# Patient Record
Sex: Female | Born: 1950
Health system: Southern US, Community
[De-identification: ages and names within clinical notes are randomized; demographics above are authoritative.]

## PROBLEM LIST (undated history)

## (undated) DIAGNOSIS — D649 Anemia, unspecified: Secondary | ICD-10-CM

## (undated) DIAGNOSIS — E785 Hyperlipidemia, unspecified: Secondary | ICD-10-CM

## (undated) DIAGNOSIS — E119 Type 2 diabetes mellitus without complications: Secondary | ICD-10-CM

## (undated) DIAGNOSIS — M1990 Unspecified osteoarthritis, unspecified site: Secondary | ICD-10-CM

## (undated) DIAGNOSIS — R0600 Dyspnea, unspecified: Secondary | ICD-10-CM

## (undated) DIAGNOSIS — N200 Calculus of kidney: Secondary | ICD-10-CM

## (undated) DIAGNOSIS — E0590 Thyrotoxicosis, unspecified without thyrotoxic crisis or storm: Secondary | ICD-10-CM

## (undated) DIAGNOSIS — Z87442 Personal history of urinary calculi: Secondary | ICD-10-CM

## (undated) DIAGNOSIS — K219 Gastro-esophageal reflux disease without esophagitis: Secondary | ICD-10-CM

## (undated) DIAGNOSIS — I10 Essential (primary) hypertension: Secondary | ICD-10-CM

## (undated) DIAGNOSIS — E559 Vitamin D deficiency, unspecified: Secondary | ICD-10-CM

## (undated) DIAGNOSIS — R509 Fever, unspecified: Secondary | ICD-10-CM

## (undated) HISTORY — PX: TONSILLECTOMY: SUR1361

## (undated) HISTORY — DX: Type 2 diabetes mellitus without complications: E11.9

## (undated) HISTORY — PX: COLONOSCOPY W/ POLYPECTOMY: SHX1380

## (undated) HISTORY — DX: Essential (primary) hypertension: I10

## (undated) HISTORY — DX: Hyperlipidemia, unspecified: E78.5

## (undated) HISTORY — PX: OTHER SURGICAL HISTORY: SHX169

## (undated) HISTORY — PX: ABDOMINAL HYSTERECTOMY: SHX81

---

## 2012-05-25 DIAGNOSIS — R079 Chest pain, unspecified: Secondary | ICD-10-CM

## 2012-05-25 DIAGNOSIS — I10 Essential (primary) hypertension: Secondary | ICD-10-CM

## 2017-08-22 LAB — LIPID PANEL
Cholesterol: 277 — AB (ref 0–200)
HDL: 46 (ref 35–70)
LDL CALC: 159
Triglycerides: 360 — AB (ref 40–160)

## 2017-08-22 LAB — BASIC METABOLIC PANEL
BUN: 9 (ref 4–21)
Creatinine: 0.8 (ref <=1.1)

## 2017-08-22 LAB — VITAMIN D 25 HYDROXY (VIT D DEFICIENCY, FRACTURES): Vit D, 25-Hydroxy: 10.4

## 2017-08-22 LAB — TSH: TSH: 1.23 (ref <=5.90)

## 2017-08-22 LAB — HEMOGLOBIN A1C: Hemoglobin A1C: 7

## 2017-08-22 LAB — HEPATIC FUNCTION PANEL: ALK PHOS: 163 — AB (ref 25–125)

## 2017-08-22 LAB — VITAMIN B12: VITAMIN B 12: 464

## 2017-09-18 ENCOUNTER — Ambulatory Visit: Payer: BLUE CROSS/BLUE SHIELD | Admitting: "Endocrinology

## 2017-09-18 ENCOUNTER — Encounter: Payer: Self-pay | Admitting: "Endocrinology

## 2017-09-18 VITALS — BP 126/80 | HR 81 | Ht 63.8 in | Wt 155.0 lb

## 2017-09-18 DIAGNOSIS — E210 Primary hyperparathyroidism: Secondary | ICD-10-CM | POA: Insufficient documentation

## 2017-09-18 DIAGNOSIS — E119 Type 2 diabetes mellitus without complications: Secondary | ICD-10-CM | POA: Insufficient documentation

## 2017-09-18 DIAGNOSIS — E559 Vitamin D deficiency, unspecified: Secondary | ICD-10-CM | POA: Insufficient documentation

## 2017-09-18 DIAGNOSIS — I10 Essential (primary) hypertension: Secondary | ICD-10-CM | POA: Diagnosis not present

## 2017-09-18 DIAGNOSIS — E212 Other hyperparathyroidism: Secondary | ICD-10-CM | POA: Diagnosis not present

## 2017-09-18 NOTE — Progress Notes (Signed)
Consult Note       09/18/2017, 5:57 PM  Beth Garcia is a 67 y.o.-year-old female, referred by her PMD, Dr. Gar Ponto , for evaluation for hypercalcemia/hyperparathyroidism.   Past Medical History:  Diagnosis Date  . Diabetes mellitus, type II ([place])   . Hyperlipidemia   . Hypertension     Past Surgical History:  Procedure Laterality Date  . ABDOMINAL HYSTERECTOMY    . TONSILLECTOMY      Social History   Tobacco Use  . Smoking status: Never Smoker  . Smokeless tobacco: Never Used  Substance Use Topics  . Alcohol use: No    Frequency: Never  . Drug use: No    Outpatient Encounter Medications as of 09/18/2017  Medication Sig  . atorvastatin (LIPITOR) 10 MG tablet Take 10 mg by mouth daily.  . Cholecalciferol (VITAMIN D3) 50000 units TABS Take by mouth.  . Cinnamon 500 MG TABS Take by mouth daily.  Marland Kitchen lisinopril (PRINIVIL,ZESTRIL) 20 MG tablet Take 20 mg by mouth daily.  Marland Kitchen omeprazole (PRILOSEC) 40 MG capsule Take 40 mg by mouth daily.   No facility-administered encounter medications on file as of 09/18/2017.     No Known Allergies   HPI  Beth Garcia reports that she has dealt with hypercalcemia for 65-99 years complicated by nephrolithiasis x1.  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 -She does not receive definitive treatment.  Does not recall if she was ever diagnosed with hyperparathyroidism.  She denies history of osteoporosis, did not have recent bone density.   -She does not recall if she ever had 24-hour urine calcium measurement.  -Her recent labs from August 15, 2017 revealed hypercalcemia of 11.6 associated with PTH of 90. I reviewed pt's DEXA scans:  No prior history of fragility fractures or falls. No history of CKD. Last BUN/Cr: 9/0.79  she is not on HCTZ or other thiazide therapy.  Was recently found to have significant vitamin D deficiency at 10.4,  initiated on vitamin D3 5000 units daily since August 18, 2017.  she is not on calcium supplements,  she eats dairy and green, leafy, vegetables on average amounts.  she does not have a family history of hypercalcemia, pituitary tumors, thyroid cancer, or osteoporosis.  -He reports remote history of mild parathyroidism which did not require any definitive antithyroid therapy.  I reviewed her chart and she also has a history of type 2 diabetes controlled with diet and exercise, recent A1c of 7%.    ROS:  Constitutional: -No recent weight changes, no fatigue, no subjective hyperthermia, no subjective hypothermia Eyes: no blurry vision, no xerophthalmia ENT: no sore throat, no nodules palpated in throat, no dysphagia/odynophagia, no hoarseness Cardiovascular: no Chest Pain, no Shortness of Breath, no palpitations, no leg swelling Respiratory: no cough, no SOB Gastrointestinal: no Nausea/Vomiting/Diarhhea Musculoskeletal: no muscle/joint aches Skin: no rashes Neurological: no tremors, no numbness, no tingling, no dizziness Psychiatric: no depression, no anxiety  PE: BP 126/80   Pulse 81   Ht 5' 3.8" (1.621 m)   Wt 155 lb (70.3 kg)   BMI 26.77 kg/m  Wt Readings  from Last 3 Encounters:  09/18/17 155 lb (70.3 kg)   Constitutional: + Appropriate weight for height, not in acute distress, normal state of mind Eyes: PERRLA, EOMI, no exophthalmos ENT: moist mucous membranes, no thyromegaly, no cervical lymphadenopathy Cardiovascular: normal precordial activity, Regular Rate and Rhythm, no Murmur/Rubs/Gallops Respiratory:  adequate breathing efforts, no gross chest deformity, Clear to auscultation bilaterally Gastrointestinal: abdomen soft, Non -tender, No distension, Bowel Sounds present Musculoskeletal: no gross deformities, strength intact in all four extremities Skin: moist, warm, no rashes Neurological: no tremor with outstretched hands, Deep tendon reflexes normal in all four  extremities.  Recent Results (from the past 2160 hour(s))  VITAMIN D 25 Hydroxy (Vit-D Deficiency, Fractures)     Status: None   Collection Time: 08/22/17 12:00 AM  Result Value Ref Range   Vit D, 25-Hydroxy 33.2   Basic metabolic panel     Status: None   Collection Time: 08/22/17 12:00 AM  Result Value Ref Range   BUN 9 4 - 21   Creatinine 0.8 0.5 - 1.1  Lipid panel     Status: Abnormal   Collection Time: 08/22/17 12:00 AM  Result Value Ref Range   Triglycerides 360 (A) 40 - 160   Cholesterol 277 (A) 0 - 200   HDL 46 35 - 70   LDL Cholesterol 159   Hepatic function panel     Status: Abnormal   Collection Time: 08/22/17 12:00 AM  Result Value Ref Range   Alkaline Phosphatase 163 (A) 25 - 125  Vitamin B12     Status: None   Collection Time: 08/22/17 12:00 AM  Result Value Ref Range   Vitamin B-12 464   TSH     Status: None   Collection Time: 08/22/17 12:00 AM  Result Value Ref Range   TSH 1.23 0.41 - 5.90  Hemoglobin A1c     Status: None   Collection Time: 08/22/17 12:00 AM  Result Value Ref Range   Hemoglobin A1C 7    On August 22, 2017 her PTH was elevated at 90 (normal 15-65)   Assessment: 1. Hypercalcemia / Hyperparathyroidism  Plan: Patient has had several instances of elevated calcium, with the highest level being at 11.6 mg/dL. A corresponding intact PTH level was also high, at 90.  - Patient also  has vitamin D deficiency,  with the last level being 10.4.  She is on appropriate vitamin D supplement with vitamin D3 5000 units daily on her fifth week of treatment. -She had at least one episode of nephrolithiasis as a complication of hypercalcemia/hyperparathyroidism: no history of    Osteoporosis, fragility fractures. No abdominal pain, no major mood disorders, no bone pain.  She has elevated alkaline phosphatase.  - I discussed with the patient about the physiology of calcium and parathyroid hormone, and possible  effects of  increased PTH/ Calcium , including  kidney stones, cardiac dysrhythmias, seizure disorders, osteoporosis, abdominal pain, etc.   - The work up so far is not sufficient to reach a conclusion for definitive therapy.  she  needs more studies to confirm and classify the parathyroid dysfunction she may have. I will proceed to obtain  repeat intact PTH/calcium, serum magnesium, serum phosphorus.  It is also essential to obtain 24-hour urine calcium/creatinine to rule out the rare but important cause of mild elevation in calcium and PTH- [place] ( Familial Hypocalciuric Hypercalcemia), which may not require any active intervention.  - I will request for her next DEXA scan to include the distal  33% of  radius for evaluation of cortical bone, which is predominantly affected by hyperparathyroidism.   -If she is confirmed to have primary hyperparathyroidism, she is a surgical candidate for parathyroidectomy. -Clinically she appears to be euthyroid and TSH is within normal range, no need for thyroid intervention. -She has a well-controlled type 1 diabetes with A1c of 7%, wishes to address it with her PMD.  - Time spent with the patient: 45 minutes, of which >50% was spent in obtaining information about her symptoms, reviewing her previous labs, evaluations, and treatments, counseling her about her hypercalcemia/hyperparathyroidism, and developing a plan to confirm the diagnosis and long term treatment as necessary.  Charlesetta Garibaldi participated in the discussions, expressed understanding, and voiced agreement with the above plans.  All questions were answered to her satisfaction. she is encouraged to contact clinic should she have any questions or concerns prior to her return visit.  - Return in about 2 weeks (around 10/02/2017) for follow up with pre-visit labs, 24 Hours Urine Calcium, follow up with Bone Density.   Glade Lloyd, MD [facility] [address] [place], [place] [address] Phone:  [phone]  Fax: [phone]    This note was partially dictated with voice recognition software. Similar sounding words can be transcribed inadequately or may not  be corrected upon review.  09/18/2017, 5:57 PM

## 2017-10-02 LAB — QUESTASSURED™ 25-HYDROXY AND 1,25-DIHYDROXYVITAMIN D
VITAMIN D 1, 25 (OH) TOTAL: 88 pg/mL — AB (ref 18–72)
Vitamin D, 25-OH, D2: <4 ng/mL
Vitamin D, 25-OH, D3: 41 ng/mL
Vitamin D, 25-OH, Total: 41 ng/mL (ref 30–100)
Vitamin D2 1, 25 (OH)2: <8 pg/mL
Vitamin D3 1, 25 (OH)2: 88 pg/mL

## 2017-10-02 LAB — PTH, INTACT AND CALCIUM
Calcium: 11.8 mg/dL — ABNORMAL HIGH (ref 8.6–10.4)
PTH: 155 pg/mL — ABNORMAL HIGH (ref 14–64)

## 2017-10-02 LAB — CORTISOL, URINE, 24 HOUR
24 HOUR URINE VOLUME (VMAHVA): 1700 mL
CORTISOL (UR), FREE: 16.2 ug/(24.h) (ref 4.0–50.0)
RESULTS RECEIVED: 0.96 g/(24.h) (ref 0.50–2.15)

## 2017-10-02 LAB — PHOSPHORUS: PHOSPHORUS: 2.4 mg/dL — AB (ref 2.5–4.5)

## 2017-10-02 LAB — MAGNESIUM: MAGNESIUM: 2.1 mg/dL (ref 1.5–2.5)

## 2017-10-05 ENCOUNTER — Encounter: Payer: Self-pay | Admitting: "Endocrinology

## 2017-10-05 ENCOUNTER — Ambulatory Visit (INDEPENDENT_AMBULATORY_CARE_PROVIDER_SITE_OTHER): Payer: BLUE CROSS/BLUE SHIELD | Admitting: "Endocrinology

## 2017-10-05 VITALS — BP 123/73 | HR 78 | Ht 63.8 in | Wt 152.0 lb

## 2017-10-05 DIAGNOSIS — E212 Other hyperparathyroidism: Secondary | ICD-10-CM | POA: Diagnosis not present

## 2017-10-05 DIAGNOSIS — E210 Primary hyperparathyroidism: Secondary | ICD-10-CM | POA: Diagnosis not present

## 2017-10-05 NOTE — Progress Notes (Signed)
Consult Note       10/05/2017, 3:43 PM  Beth Garcia is a 67 y.o.-year-old female, referred by her PMD, Dr. Gar Ponto , for evaluation for hypercalcemia/hyperparathyroidism.   Past Medical History:  Diagnosis Date   Diabetes mellitus, type II ([place])    Hyperlipidemia    Hypertension     Past Surgical History:  Procedure Laterality Date   ABDOMINAL HYSTERECTOMY     TONSILLECTOMY      Social History   Tobacco Use   Smoking status: Never Smoker   Smokeless tobacco: Never Used  Substance Use Topics   Alcohol use: No    Frequency: Never   Drug use: No    Outpatient Encounter Medications as of 10/05/2017  Medication Sig   atorvastatin (LIPITOR) 10 MG tablet Take 10 mg by mouth daily.   Cholecalciferol (VITAMIN D3) 50000 units TABS Take by mouth.   Cinnamon 500 MG TABS Take by mouth daily.   lisinopril (PRINIVIL,ZESTRIL) 20 MG tablet Take 20 mg by mouth daily.   omeprazole (PRILOSEC) 40 MG capsule Take 40 mg by mouth daily.   No facility-administered encounter medications on file as of 10/05/2017.     No Known Allergies   HPI  Beth Garcia reports that she has dealt with hypercalcemia for 60-45 years complicated by nephrolithiasis x1.  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 -She never received definitive treatment for hyperparathyroidism.   She denies history of osteoporosis.  she does not have recent bone density.   No prior history of fragility fractures or falls. No history of CKD. Last BUN/Cr: 9/0.79  she is not on HCTZ or other thiazide therapy.  Was recently found to have significant vitamin D deficiency at 10.4, initiated on vitamin D3 5000 units daily since August 18, 2017.  -Her most recent vitamin D panel showed corrected 25 hydroxy vitamin D at  64, 1, 25 dihydroxy vitamin D elevated at 105.  she is not on calcium supplements,  she eats dairy and green, leafy, vegetables on average amounts.  she does not have a family history of hypercalcemia, pituitary tumors, thyroid cancer, or osteoporosis.  -He reports remote history of mild hyperthyroidism which did not require any definitive antithyroid therapy.  I reviewed her chart and she also has a history of type 2 diabetes controlled with diet and exercise, recent A1c of 7%.    ROS:  Constitutional: -No recent weight change, no fatigue, no subjective hyperthermia nor hyponatremia.   Eyes: no blurry vision, no xerophthalmia ENT: no sore throat, no nodules palpated in throat, no dysphagia/odynophagia, no hoarseness Cardiovascular: no Chest Pain, no Shortness of Breath, no palpitations, no leg swelling Respiratory: no cough, no SOB Gastrointestinal: no Nausea/Vomiting/Diarhhea Musculoskeletal: no muscle/joint aches Skin: no rashes Neurological: No tremors, no numbness, no tingling.    Psychiatric: no depression, no anxiety  PE: BP 123/73    Pulse 78    Ht 5' 3.8" (1.621 m)    Wt 152 lb (68.9 kg)  BMI 26.25 kg/m  Wt Readings from Last 3 Encounters:  10/05/17 152 lb (68.9 kg)  09/18/17 155 lb (70.3 kg)   Constitutional: + Appropriate weight for height, alert and oriented x3, not in acute distress.   Eyes: PERRLA, EOMI, no exophthalmos ENT: moist mucous membranes, no thyromegaly, no cervical lymphadenopathy Musculoskeletal: no gross deformities, strength intact in all four extremities Skin: moist, warm, no rashes Neurological: no tremor with outstretched hands, Deep tendon reflexes normal in all four extremities.  Recent Results (from the past 2160 hour(s))  VITAMIN D 25 Hydroxy (Vit-D Deficiency, Fractures)     Status: None   Collection Time: 08/22/17 12:00 AM  Result Value Ref Range   Vit D, 25-Hydroxy 13.0   Basic metabolic panel     Status: None   Collection Time:  08/22/17 12:00 AM  Result Value Ref Range   BUN 9 4 - 21   Creatinine 0.8 0.5 - 1.1  Lipid panel     Status: Abnormal   Collection Time: 08/22/17 12:00 AM  Result Value Ref Range   Triglycerides 360 (A) 40 - 160   Cholesterol 277 (A) 0 - 200   HDL 46 35 - 70   LDL Cholesterol 159   Hepatic function panel     Status: Abnormal   Collection Time: 08/22/17 12:00 AM  Result Value Ref Range   Alkaline Phosphatase 163 (A) 25 - 125  Vitamin B12     Status: None   Collection Time: 08/22/17 12:00 AM  Result Value Ref Range   Vitamin B-12 464   TSH     Status: None   Collection Time: 08/22/17 12:00 AM  Result Value Ref Range   TSH 1.23 0.41 - 5.90  Hemoglobin A1c     Status: None   Collection Time: 08/22/17 12:00 AM  Result Value Ref Range   Hemoglobin A1C 7   PTH, intact and calcium     Status: Abnormal   Collection Time: 09/28/17  7:27 AM  Result Value Ref Range   PTH 155 (H) 14 - 64 pg/mL    Comment: . Interpretive Guide    Intact PTH           Calcium ------------------    ----------           ------- Normal Parathyroid    Normal               Normal Hypoparathyroidism    Low or Low Normal    Low Hyperparathyroidism    Primary            Normal or High       High    Secondary          High                 Normal or Low    Tertiary           High                 High Non-Parathyroid    Hypercalcemia      Low or Low Normal    High .    Calcium 11.8 (H) 8.6 - 10.4 mg/dL  Magnesium     Status: None   Collection Time: 09/28/17  7:27 AM  Result Value Ref Range   Magnesium 2.1 1.5 - 2.5 mg/dL  Phosphorus     Status: Abnormal   Collection Time: 09/28/17  7:27 AM  Result Value Ref Range  Phosphorus 2.4 (L) 2.5 - 4.5 mg/dL  [employer] 25-Hydroxy and 1,25-Dihydroxyvitamin D     Status: Abnormal   Collection Time: 09/28/17  7:27 AM  Result Value Ref Range   Vitamin D, 25-OH, Total 41 30 - 100 ng/mL    Comment: .  25-OHD3 indicates both endogenous production  and  supplementation.  25-OHD2 is an indicator of  exogenous sources, such as diet or supplementation.  Therapy is based on measurement of Total 25-OHD,  with levels <20 ng/mL indicative of Vitamin D  deficiency, while levels between 20 ng/mL and  30 ng/mL suggest insufficiency.  Optimal levels are > or = 30 ng/mL. Marland Kitchen  For additional information, please refer to  http://education.questdiagnostics.com/faq/FAQ163  (This link is being provided for   information/educational purposes only.)    Vitamin D, 25-OH, D3 41 See Below ng/mL    Comment: Reference Range: Not established   Vitamin D, 25-OH, D2 <4 See Below ng/mL    Comment: Reference Range: Not established   Vitamin D 1, 25 (OH)2 Total 88 (H) 18 - 72 pg/mL   Vitamin D3 1, 25 (OH)2 88 pg/mL   Vitamin D2 1, 25 (OH)2 <8 pg/mL    Comment: Marland Kitchen  Vitamin D, 1,25-Dihydroxy, LC/MS/MS:  Vitamin D3, 1,25(OH) indicates both endogenous  production and supplementation. Vitamin D2, 1,25(OH)2  is an indicator of exogenous sources, such as  diet or supplementation. Interpretation and  therapy are based on measurement of  Vitamin D, 1,25 (OH)2, Total. .  This test was developed and its analytical performance  characteristics have been determined by [facility]. It has not been cleared or approved by the [place] Food and Drug Administration. This assay has been validated  pursuant to the CLIA regulations and is used for clinical  purposes.   Cortisol,free,24 hour urine w/creatinine     Status: None   Collection Time: 09/28/17  7:27 AM  Result Value Ref Range   24 Hour urine volume (VMAHVA) 1,700 mL   Cortisol (Ur), Free 16.2 4.0 - 50.0 mcg/24 h    Comment: . Analysis performed by [employer] . This test was developed and its analytical performance characteristics have been determined by [facility]. It has not been cleared or approved by FDA. This assay has been  validated pursuant to the CLIA regulations and is used for clinical purposes.    RESULTS RECEIVED 0.96 0.50 - 2.15 g/24 h   On August 22, 2017 her PTH was elevated at 90 (normal 15-65)   Assessment: 1. Hypercalcemia / Hyperparathyroidism  Plan: Patient has had several instances of elevated calcium, with the highest level being at 11.8 mg/dL. A corresponding intact PTH level was also high, at 155 increasing from 90.  - Patient is vitamin D replete, remains on vitamin D supplement.   -She had at least one episode of nephrolithiasis as a complication of hypercalcemia/hyperparathyroidism: no history of    Osteoporosis, fragility fractures. No abdominal pain, no major mood disorders, no bone pain.  She has elevated alkaline phosphatase.  - I discussed with the patient about the physiology of calcium and parathyroid hormone, and possible  effects of  increased PTH/ Calcium , including kidney stones, cardiac dysrhythmias, seizure disorders, osteoporosis, abdominal pain, etc.  -24-hour urine calcium measurement was missed.  -She is a surgical candidate for primary hyperparathyroidism.  I discussed this option with her and she agrees with referral to Dr. Harlow Asa.  - I will request for her  next DEXA scan to include the distal  33% of  radius for evaluation of cortical bone, which is predominantly affected by hyperparathyroidism.   -Clinically she appears to be euthyroid and TSH is within normal range, no need for thyroid intervention. -She has a well-controlled type 1 diabetes with A1c of 7%, wishes to address it with her PMD.   - Time spent with the patient: 25 min, of which >50% was spent in reviewing her  current and  previous labs, previous treatments.  Beth Garcia participated in the discussions, expressed understanding, and voiced agreement with the above plans.  All questions were answered to her satisfaction. she is encouraged to contact clinic should she have any questions or concerns  prior to her return visit.  - Return in about 3 months (around 01/04/2018) for follow up with labs after surgery.   Glade Lloyd, MD [facility] [address] [place], [place] [address] Phone: [phone]  Fax: [phone]   This note was partially dictated with voice recognition software. Similar sounding words can be transcribed inadequately or may not  be corrected upon review.  10/05/2017, 3:43 PM

## 2017-10-13 ENCOUNTER — Telehealth: Payer: Self-pay | Admitting: "Endocrinology

## 2017-10-13 NOTE — Telephone Encounter (Signed)
Beth Garcia is calling stating that she has not heard from the surgeons office yet please advise?

## 2017-10-13 NOTE — Telephone Encounter (Signed)
Attempted to call pt. Phone not in service at this time. Sent referral again and will contact Dr Tera Helper office to confirm they received it

## 2017-11-02 ENCOUNTER — Other Ambulatory Visit (HOSPITAL_COMMUNITY): Payer: Self-pay | Admitting: Surgery

## 2017-11-02 DIAGNOSIS — E210 Primary hyperparathyroidism: Secondary | ICD-10-CM

## 2017-11-13 ENCOUNTER — Ambulatory Visit (HOSPITAL_COMMUNITY)
Admission: RE | Admit: 2017-11-13 | Discharge: 2017-11-13 | Disposition: A | Discharge status: Home / Self Care | Payer: BLUE CROSS/BLUE SHIELD | Source: Ambulatory Visit | Attending: Surgery | Admitting: Surgery

## 2017-11-13 DIAGNOSIS — R946 Abnormal results of thyroid function studies: Secondary | ICD-10-CM | POA: Insufficient documentation

## 2017-11-13 DIAGNOSIS — E034 Atrophy of thyroid (acquired): Secondary | ICD-10-CM | POA: Diagnosis not present

## 2017-11-13 DIAGNOSIS — E210 Primary hyperparathyroidism: Secondary | ICD-10-CM | POA: Insufficient documentation

## 2017-12-05 ENCOUNTER — Other Ambulatory Visit (HOSPITAL_COMMUNITY): Payer: Self-pay | Admitting: Surgery

## 2017-12-05 DIAGNOSIS — E210 Primary hyperparathyroidism: Secondary | ICD-10-CM

## 2017-12-20 ENCOUNTER — Encounter (HOSPITAL_COMMUNITY): Payer: Self-pay | Admitting: Radiology

## 2017-12-20 ENCOUNTER — Ambulatory Visit (HOSPITAL_COMMUNITY)
Admission: RE | Admit: 2017-12-20 | Discharge: 2017-12-20 | Disposition: A | Discharge status: Home / Self Care | Payer: BLUE CROSS/BLUE SHIELD | Source: Ambulatory Visit | Attending: Surgery | Admitting: Surgery

## 2017-12-20 DIAGNOSIS — E042 Nontoxic multinodular goiter: Secondary | ICD-10-CM | POA: Insufficient documentation

## 2017-12-20 DIAGNOSIS — E210 Primary hyperparathyroidism: Secondary | ICD-10-CM | POA: Insufficient documentation

## 2017-12-20 LAB — POCT I-STAT CREATININE: Creatinine, Ser: 0.9 mg/dL (ref 0.44–1.00)

## 2017-12-20 MED ORDER — IOHEXOL 300 MG/ML  SOLN
75.0000 mL | Freq: Once | INTRAMUSCULAR | Status: AC | PRN
  Administered 2017-12-20: 75 mL via INTRAVENOUS

## 2018-01-01 ENCOUNTER — Ambulatory Visit: Payer: Self-pay | Admitting: Surgery

## 2018-01-01 NOTE — H&P (Signed)
General Surgery [facility]  [name] DOB: Jul 12, 1950 Married / Language: English / Race: White Female   History of Present Illness  The patient is a 67 year old female who presents with primary hyperparathyroidism.  CHIEF COMPLAINT: primary hyperparathyroidism  Patient is referred by Dr. Gar Ponto and Dr. Bud Face for evaluation and management of suspected primary hyperparathyroidism. Patient has a long-standing history of hypercalcemia. This is being monitored by her primary care physician. She also has a distant history of hyperthyroidism for which she did not require additional treatment. Her most recent TSH level is normal at 1.23. She is not on any thyroid medications. Patient has had recent laboratory studies showing an elevated calcium level at 11.8 and an elevated intact PTH level at 155. 25-hydroxy vitamin D level was normal at 41. Patient does take vitamin D supplements. Patient had a recent bone density scan which unfortunately does show osteoporosis with a T score of -2.8. Patient underwent a nuclear medicine parathyroid scan on August 25, 2017. This was negative for parathyroid adenoma. Patient has had no prior head or neck surgery. There is no family history of endocrine neoplasms. 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 She presents today accompanied by her husband.   Past Surgical History Breast Biopsy  Bilateral. Colon Polyp Removal - Colonoscopy  Hysterectomy (not due to cancer) - Partial   Diagnostic Studies History  Colonoscopy  5-10 years ago Mammogram  >3 years ago Pap Smear  >5 years ago  Allergies  No Known Drug Allergies  Allergies Reconciled   Medication History Atorvastatin Calcium (10MG  Tablet, Oral) Active. Lisinopril (20MG  Tablet, Oral) Active. Omeprazole (40MG  Capsule DR, Oral) Active. Vitamin D3 (5000UNIT Capsule, Oral) Active. Cinnamon (500MG   Tablet, Oral) Active. Medications Reconciled  Social History Alcohol use  Occasional alcohol use. Caffeine use  Carbonated beverages, Coffee. No drug use  Tobacco use  Never smoker.  Family History Alcohol Abuse  Family Members In General. Arthritis  Father, Mother, Sister. Cerebrovascular Accident  Family Members In General. Depression  Daughter, Family Members In General. Hypertension  Daughter, Mother, Sister. Migraine Headache  Daughter. Thyroid problems  Family Members In General.  Pregnancy / Birth History Age at menarche  81 years. Age of menopause  [age] Contraceptive History  Oral contraceptives. Gravida  2 Length (months) of breastfeeding  7-12 Maternal age  76-25 Para  2  Other Problems Arthritis  Back Pain  Diabetes Mellitus  Gastroesophageal Reflux Disease  High blood pressure  Hypercholesterolemia  Oophorectomy  Left. Thyroid Disease     Review of Systems General Present- Fatigue. Not Present- Appetite Loss, Chills, Fever, Night Sweats, Weight Gain and Weight Loss. Skin Not Present- Change in Wart/Mole, Dryness, Hives, Jaundice, New Lesions, Non-Healing Wounds, Rash and Ulcer. HEENT Present- Hoarseness. Not Present- Earache, Hearing Loss, Nose Bleed, Oral Ulcers, Ringing in the Ears, Seasonal Allergies, Sinus Pain, Sore Throat, Visual Disturbances, Wears glasses/contact lenses and Yellow Eyes. Respiratory Present- Chronic Cough. Not Present- Bloody sputum, Difficulty Breathing, Snoring and Wheezing. Breast Not Present- Breast Mass, Breast Pain, Nipple Discharge and Skin Changes. Cardiovascular Present- Palpitations, Rapid Heart Rate and Shortness of Breath. Not Present- Chest Pain, Difficulty Breathing Lying Down, Leg Cramps and Swelling of Extremities. Female Genitourinary Present- Frequency. Not Present- Nocturia, Painful Urination, Pelvic Pain and Urgency. Musculoskeletal Present- Back Pain and Muscle Weakness. Not Present-  Joint Pain, Joint Stiffness, Muscle Pain and Swelling of Extremities. Endocrine Present- New Diabetes. Not  Present- Cold Intolerance, Excessive Hunger, Hair Changes, Heat Intolerance and Hot flashes.  Vitals  Weight: 152.4 lb Height: 62in Body Surface Area: 1.7 m Body Mass Index: 27.87 kg/m  Temp.: 98.60F(Temporal)  Pulse: 79 (Regular)  BP: 122/78 (Sitting, Left Arm, Standard)   Physical Exam  See vital signs recorded above  GENERAL APPEARANCE Development: normal Nutritional status: normal Gross deformities: none  SKIN Rash, lesions, ulcers: none Induration, erythema: none Nodules: none palpable  EYES Conjunctiva and lids: normal Pupils: equal and reactive Iris: normal bilaterally  EARS, NOSE, MOUTH, THROAT External ears: no lesion or deformity External nose: no lesion or deformity Hearing: grossly normal Lips: no lesion or deformity Dentition: normal for age Oral mucosa: moist  NECK Symmetric: yes Trachea: midline Thyroid: no palpable nodules in the thyroid bed  CHEST Respiratory effort: normal Retraction or accessory muscle use: no Breath sounds: normal bilaterally Rales, rhonchi, wheeze: none  CARDIOVASCULAR Auscultation: regular rhythm, normal rate Murmurs: none Pulses: carotid and radial pulse 2+ palpable Lower extremity edema: none Lower extremity varicosities: none  MUSCULOSKELETAL Station and gait: normal Digits and nails: no clubbing or cyanosis Muscle strength: grossly normal all extremities Range of motion: grossly normal all extremities Deformity: none  LYMPHATIC Cervical: none palpable Supraclavicular: none palpable  PSYCHIATRIC Oriented to person, place, and time: yes Mood and affect: normal for situation Judgment and insight: appropriate for situation    Assessment & Plan  PRIMARY HYPERPARATHYROIDISM (E21.0)  Pt Education - Pamphlet Given - The Parathyroid Surgery Book: discussed with patient and provided  information. Follow Up - Call [facility] office after tests / studies doneto discuss further plans  Patient presents today accompanied by her husband to be evaluated for suspected primary hyperparathyroidism. Patient is provided with written literature on parathyroid disease to review at home.  Patient does have biochemical evidence of primary hyperparathyroidism. She is symptomatic with intermittent fatigue and with significant osteoporosis. I have recommended proceeding with further evaluation to include a 24-hour urine collection for calcium and a high-resolution ultrasound examination of the neck. Nuclear medicine parathyroid scan was negative for parathyroid adenoma. This occurs at approximately 20% of studies. Hopefully the ultrasound will identify an abnormal parathyroid gland. If not, we will order a 4D CT scan of the neck with parathyroid protocol in hopes of localizing a parathyroid adenoma.  We discussed the fact that 95% of patients have a single gland adenoma. There is a possibility of multi-gland disease. We discussed minimally invasive parathyroid surgery versus the traditional neck exploration. Patient understands and agrees to proceed with further workup at this time.  Patient will undergo the above studies and we will contact her with those results when they are available.  ADDENDUM The 4D CT scan was positive for two abnormal parathyroid glands on the right side, most likely representing adenomas.  Plan right neck exploration and parathyroidectomy with frozen sections.  The risks and benefits of the procedure have been discussed at length with the patient.  The patient understands the proposed procedure, potential alternative treatments, and the course of recovery to be expected.  All of the patient's questions have been answered at this time.  The patient wishes to proceed with surgery.  Armandina Gemma, [place] [facility] Office: [phone]

## 2018-01-26 ENCOUNTER — Encounter (HOSPITAL_COMMUNITY): Payer: Self-pay

## 2018-01-26 NOTE — Patient Instructions (Signed)
Your procedure is scheduled on: Thursday, Aug. 1, 2019   Surgery Time:  8:30AM-9:30AM   Report to [facility] Main  Entrance    Report to admitting at 6:30 AM   Call this number if you have problems the morning of surgery [phone]   Do not eat food or drink liquids :After Midnight.   Do NOT smoke after Midnight   Take these medicines the morning of surgery with A SIP OF WATER: Atorvastatin, Omeprazole   DO NOT TAKE ANY DIABETIC MEDICATIONS DAY OF YOUR SURGERY                               You may not have any metal on your body including hair pins, jewelry, and body piercings             Do not wear make-up, lotions, powders, perfumes/cologne, or deodorant             Do not wear nail polish.  Do not shave  48 hours prior to surgery.                Do not bring valuables to the hospital. [place].   Contacts, dentures or bridgework may not be worn into surgery.    Patients discharged the day of surgery will not be allowed to drive home.   Special Instructions: Bring a copy of your healthcare power of attorney and living will documents         the day of surgery if you haven't scanned them in before.              Please read over the following fact sheets you were given:  [facility] - Preparing for Surgery Before surgery, you can play an important role.  Because skin is not sterile, your skin needs to be as free of germs as possible.  You can reduce the number of germs on your skin by washing with CHG (chlorahexidine gluconate) soap before surgery.  CHG is an antiseptic cleaner which kills germs and bonds with the skin to continue killing germs even after washing. Please DO NOT use if you have an allergy to CHG or antibacterial soaps.  If your skin becomes reddened/irritated stop using the CHG and inform your nurse when you arrive at Short Stay. Do not shave (including legs and underarms) for at least 48 hours  prior to the first CHG shower.  You may shave your face/neck.  Please follow these instructions carefully:  1.  Shower with CHG Soap the night before surgery and the  morning of surgery.  2.  If you choose to wash your hair, wash your hair first as usual with your normal  shampoo.  3.  After you shampoo, rinse your hair and body thoroughly to remove the shampoo.                             4.  Use CHG as you would any other liquid soap.  You can apply chg directly to the skin and wash.  Gently with a scrungie or clean washcloth.  5.  Apply the CHG Soap to your body ONLY FROM THE NECK DOWN.   Do   not use on face/ open  Wound or open sores. Avoid contact with eyes, ears mouth and   genitals (private parts).                       Wash face,  Genitals (private parts) with your normal soap.             6.  Wash thoroughly, paying special attention to the area where your    surgery  will be performed.  7.  Thoroughly rinse your body with warm water from the neck down.  8.  DO NOT shower/wash with your normal soap after using and rinsing off the CHG Soap.                9.  Pat yourself dry with a clean towel.            10.  Wear clean pajamas.            11.  Place clean sheets on your bed the night of your first shower and do not  sleep with pets. Day of Surgery : Do not apply any lotions/deodorants the morning of surgery.  Please wear clean clothes to the hospital/surgery center.  FAILURE TO FOLLOW THESE INSTRUCTIONS MAY RESULT IN THE CANCELLATION OF YOUR SURGERY  PATIENT SIGNATURE_________________________________  NURSE SIGNATURE__________________________________  ________________________________________________________________________

## 2018-01-28 ENCOUNTER — Encounter (HOSPITAL_COMMUNITY): Payer: Self-pay | Admitting: Surgery

## 2018-01-28 DIAGNOSIS — M810 Age-related osteoporosis without current pathological fracture: Secondary | ICD-10-CM | POA: Diagnosis present

## 2018-01-28 NOTE — H&P (Signed)
General Surgery [facility]  Beth Garcia DOB: June 21, 1951 Married / Language: English / Race: White Female   History of Present Illness   The patient is a 67 year old female who presents with primary hyperparathyroidism.  CHIEF COMPLAINT: primary hyperparathyroidism  Patient is referred by Dr. Gar Ponto and Dr. Bud Face for evaluation and management of suspected primary hyperparathyroidism. Patient has a long-standing history of hypercalcemia. This is being monitored by her primary care physician. She also has a distant history of hyperthyroidism for which she did not require additional treatment. Her most recent TSH level is normal at 1.23. She is not on any thyroid medications. Patient has had recent laboratory studies showing an elevated calcium level at 11.8 and an elevated intact PTH level at 155. 25-hydroxy vitamin D level was normal at 41. Patient does take vitamin D supplements. Patient had a recent bone density scan which unfortunately does show osteoporosis with a T score of -2.8. Patient underwent a nuclear medicine parathyroid scan on August 25, 2017. This was negative for parathyroid adenoma. Patient has had no prior head or neck surgery. There is no family history of endocrine neoplasms. 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 She presents today accompanied by her husband.   Past Surgical History  Breast Biopsy  Bilateral. Colon Polyp Removal - Colonoscopy  Hysterectomy (not due to cancer) - Partial   Diagnostic Studies History  Colonoscopy  5-10 years ago Mammogram  >3 years ago Pap Smear  >5 years ago  Allergies  No Known Drug Allergies [11/01/2017]: Allergies Reconciled   Medication History Atorvastatin Calcium (10MG  Tablet, Oral) Active. Lisinopril (20MG  Tablet, Oral) Active. Omeprazole (40MG  Capsule DR, Oral) Active. Vitamin D3 (5000UNIT Capsule, Oral)  Active. Cinnamon (500MG  Tablet, Oral) Active. Medications Reconciled  Social History Alcohol use  Occasional alcohol use. Caffeine use  Carbonated beverages, Coffee. No drug use  Tobacco use  Never smoker.  Family History Alcohol Abuse  Family Members In General. Arthritis  Father, Mother, Sister. Cerebrovascular Accident  Family Members In General. Depression  Daughter, Family Members In General. Hypertension  Daughter, Mother, Sister. Migraine Headache  Daughter. Thyroid problems  Family Members In General.  Pregnancy / Birth History Age at menarche  76 years. Age of menopause  35-60 Contraceptive History  Oral contraceptives. Gravida  2 Length (months) of breastfeeding  7-12 Maternal age  28-25 Para  2  Other Problems Arthritis  Back Pain  Diabetes Mellitus  Gastroesophageal Reflux Disease  High blood pressure  Hypercholesterolemia  Oophorectomy  Left. Thyroid Disease   Review of Systems General Present- Fatigue. Not Present- Appetite Loss, Chills, Fever, Night Sweats, Weight Gain and Weight Loss. Skin Not Present- Change in Wart/Mole, Dryness, Hives, Jaundice, New Lesions, Non-Healing Wounds, Rash and Ulcer. HEENT Present- Hoarseness. Not Present- Earache, Hearing Loss, Nose Bleed, Oral Ulcers, Ringing in the Ears, Seasonal Allergies, Sinus Pain, Sore Throat, Visual Disturbances, Wears glasses/contact lenses and Yellow Eyes. Respiratory Present- Chronic Cough. Not Present- Bloody sputum, Difficulty Breathing, Snoring and Wheezing. Breast Not Present- Breast Mass, Breast Pain, Nipple Discharge and Skin Changes. Cardiovascular Present- Palpitations, Rapid Heart Rate and Shortness of Breath. Not Present- Chest Pain, Difficulty Breathing Lying Down, Leg Cramps and Swelling of Extremities. Female Genitourinary Present- Frequency. Not Present- Nocturia, Painful Urination, Pelvic Pain and Urgency. Musculoskeletal Present- Back Pain and Muscle  Weakness. Not Present- Joint Pain, Joint Stiffness, Muscle Pain and Swelling of Extremities. Endocrine Present- New Diabetes. Not  Present- Cold Intolerance, Excessive Hunger, Hair Changes, Heat Intolerance and Hot flashes.  Vitals Weight: 152.4 lb Height: 62in Body Surface Area: 1.7 m Body Mass Index: 27.87 kg/m  Temp.: 98.21F(Temporal)  Pulse: 79 (Regular)  BP: 122/78 (Sitting, Left Arm, Standard)  Physical Exam  See vital signs recorded above  GENERAL APPEARANCE Development: normal Nutritional status: normal Gross deformities: none  SKIN Rash, lesions, ulcers: none Induration, erythema: none Nodules: none palpable  EYES Conjunctiva and lids: normal Pupils: equal and reactive Iris: normal bilaterally  EARS, NOSE, MOUTH, THROAT External ears: no lesion or deformity External nose: no lesion or deformity Hearing: grossly normal Lips: no lesion or deformity Dentition: normal for age Oral mucosa: moist  NECK Symmetric: yes Trachea: midline Thyroid: no palpable nodules in the thyroid bed  CHEST Respiratory effort: normal Retraction or accessory muscle use: no Breath sounds: normal bilaterally Rales, rhonchi, wheeze: none  CARDIOVASCULAR Auscultation: regular rhythm, normal rate Murmurs: none Pulses: carotid and radial pulse 2+ palpable Lower extremity edema: none Lower extremity varicosities: none  MUSCULOSKELETAL Station and gait: normal Digits and nails: no clubbing or cyanosis Muscle strength: grossly normal all extremities Range of motion: grossly normal all extremities Deformity: none  LYMPHATIC Cervical: none palpable Supraclavicular: none palpable  PSYCHIATRIC Oriented to person, place, and time: yes Mood and affect: normal for situation Judgment and insight: appropriate for situation    Assessment & Plan  PRIMARY HYPERPARATHYROIDISM (E21.0)  Pt Education - Pamphlet Given - The Parathyroid Surgery Book: discussed with  patient and provided information.  Follow Up - Call [facility] office after tests / studies doneto discuss further plans  Patient presents today accompanied by her husband to be evaluated for suspected primary hyperparathyroidism. Patient is provided with written literature on parathyroid disease to review at home.  Patient does have biochemical evidence of primary hyperparathyroidism. She is symptomatic with intermittent fatigue and with significant osteoporosis. I have recommended proceeding with further evaluation to include a 24-hour urine collection for calcium and a high-resolution ultrasound examination of the neck. Nuclear medicine parathyroid scan was negative for parathyroid adenoma. This occurs at approximately 20% of studies. Hopefully the ultrasound will identify an abnormal parathyroid gland. If not, we will order a 4D CT scan of the neck with parathyroid protocol in hopes of localizing a parathyroid adenoma.  We discussed the fact that 95% of patients have a single gland adenoma. There is a possibility of multi-gland disease. We discussed minimally invasive parathyroid surgery versus the traditional neck exploration. Patient understands and agrees to proceed with further workup at this time.  Patient will undergo the above studies and we will contact her with those results when they are available.  OSTEOPOROSIS, POSTMENOPAUSAL (M81.0)  ADDENDUM:  4D CT scan localizes a parathyroid adenoma and confirms diagnosis of primary hyperparathyroidism.  Plan minimally invasive surgical approach.  The risks and benefits of the procedure have been discussed at length with the patient.  The patient understands the proposed procedure, potential alternative treatments, and the course of recovery to be expected.  All of the patient's questions have been answered at this time.  The patient wishes to proceed with surgery.  Armandina Gemma, [place] [facility] Office: [phone]

## 2018-01-29 ENCOUNTER — Encounter (HOSPITAL_COMMUNITY): Payer: Self-pay

## 2018-01-29 ENCOUNTER — Other Ambulatory Visit: Payer: Self-pay

## 2018-01-29 ENCOUNTER — Encounter (HOSPITAL_COMMUNITY)
Admission: RE | Admit: 2018-01-29 | Discharge: 2018-01-29 | Disposition: A | Discharge status: Home / Self Care | Payer: BLUE CROSS/BLUE SHIELD | Source: Ambulatory Visit | Attending: Surgery | Admitting: Surgery

## 2018-01-29 DIAGNOSIS — E210 Primary hyperparathyroidism: Secondary | ICD-10-CM

## 2018-01-29 DIAGNOSIS — Z01810 Encounter for preprocedural cardiovascular examination: Secondary | ICD-10-CM

## 2018-01-29 DIAGNOSIS — Z01818 Encounter for other preprocedural examination: Secondary | ICD-10-CM | POA: Insufficient documentation

## 2018-01-29 DIAGNOSIS — D351 Benign neoplasm of parathyroid gland: Secondary | ICD-10-CM | POA: Diagnosis not present

## 2018-01-29 DIAGNOSIS — I10 Essential (primary) hypertension: Secondary | ICD-10-CM | POA: Diagnosis not present

## 2018-01-29 DIAGNOSIS — E119 Type 2 diabetes mellitus without complications: Secondary | ICD-10-CM | POA: Diagnosis not present

## 2018-01-29 DIAGNOSIS — E7800 Pure hypercholesterolemia, unspecified: Secondary | ICD-10-CM | POA: Diagnosis not present

## 2018-01-29 DIAGNOSIS — K219 Gastro-esophageal reflux disease without esophagitis: Secondary | ICD-10-CM | POA: Diagnosis not present

## 2018-01-29 DIAGNOSIS — Z79899 Other long term (current) drug therapy: Secondary | ICD-10-CM | POA: Diagnosis not present

## 2018-01-29 HISTORY — DX: Calculus of kidney: N20.0

## 2018-01-29 HISTORY — DX: Personal history of urinary calculi: Z87.442

## 2018-01-29 HISTORY — DX: Unspecified osteoarthritis, unspecified site: M19.90

## 2018-01-29 HISTORY — DX: Gastro-esophageal reflux disease without esophagitis: K21.9

## 2018-01-29 HISTORY — DX: Thyrotoxicosis, unspecified without thyrotoxic crisis or storm: E05.90

## 2018-01-29 HISTORY — DX: Hypercalcemia: E83.52

## 2018-01-29 HISTORY — DX: Fever, unspecified: R50.9

## 2018-01-29 HISTORY — DX: Vitamin D deficiency, unspecified: E55.9

## 2018-01-29 LAB — BASIC METABOLIC PANEL
Anion gap: 7 (ref 5–15)
BUN: 19 mg/dL (ref 8–23)
CALCIUM: 11.7 mg/dL — AB (ref 8.9–10.3)
CHLORIDE: 110 mmol/L (ref 98–111)
CO2: 27 mmol/L (ref 22–32)
CREATININE: 0.99 mg/dL (ref 0.44–1.00)
GFR calc non Af Amer: 59 mL/min — ABNORMAL LOW (ref >60)
Glucose, Bld: 97 mg/dL (ref 70–99)
Potassium: 4.4 mmol/L (ref 3.5–5.1)
SODIUM: 144 mmol/L (ref 135–145)

## 2018-01-29 LAB — GLUCOSE, CAPILLARY: GLUCOSE-CAPILLARY: 89 mg/dL (ref 70–99)

## 2018-01-29 LAB — HEMOGLOBIN A1C
Hgb A1c MFr Bld: 6 % — ABNORMAL HIGH (ref 4.8–5.6)
Mean Plasma Glucose: 125.5 mg/dL

## 2018-01-29 LAB — CBC
HCT: 39.4 % (ref 36.0–46.0)
Hemoglobin: 12.9 g/dL (ref 12.0–15.0)
MCH: 27.9 pg (ref 26.0–34.0)
MCHC: 32.7 g/dL (ref 30.0–36.0)
MCV: 85.1 fL (ref 78.0–100.0)
PLATELETS: 201 10*3/uL (ref 150–400)
RBC: 4.63 MIL/uL (ref 3.87–5.11)
RDW: 14.3 % (ref 11.5–15.5)
WBC: 7.6 10*3/uL (ref 4.0–10.5)

## 2018-01-29 NOTE — Pre-Procedure Instructions (Signed)
BMP and Hgb A1C results 01/29/18 faxed to Dr. Harlow Asa via epic.

## 2018-01-30 NOTE — Pre-Procedure Instructions (Signed)
Left chart with Ihechi  to follow-up with EKG results from 01/29/18.

## 2018-02-01 ENCOUNTER — Ambulatory Visit (HOSPITAL_COMMUNITY)
Admission: RE | Admit: 2018-02-01 | Discharge: 2018-02-01 | Disposition: A | Discharge status: Home / Self Care | Payer: BLUE CROSS/BLUE SHIELD | Source: Ambulatory Visit | Attending: Surgery | Admitting: Surgery

## 2018-02-01 ENCOUNTER — Ambulatory Visit (HOSPITAL_COMMUNITY): Payer: BLUE CROSS/BLUE SHIELD | Admitting: Certified Registered"

## 2018-02-01 ENCOUNTER — Encounter (HOSPITAL_COMMUNITY)
Admission: RE | Disposition: A | Discharge status: Home / Self Care | Payer: Self-pay | Source: Ambulatory Visit | Attending: Surgery

## 2018-02-01 ENCOUNTER — Encounter (HOSPITAL_COMMUNITY): Payer: Self-pay | Admitting: *Deleted

## 2018-02-01 DIAGNOSIS — E210 Primary hyperparathyroidism: Secondary | ICD-10-CM | POA: Insufficient documentation

## 2018-02-01 DIAGNOSIS — E119 Type 2 diabetes mellitus without complications: Secondary | ICD-10-CM | POA: Insufficient documentation

## 2018-02-01 DIAGNOSIS — I10 Essential (primary) hypertension: Secondary | ICD-10-CM | POA: Insufficient documentation

## 2018-02-01 DIAGNOSIS — D351 Benign neoplasm of parathyroid gland: Secondary | ICD-10-CM | POA: Insufficient documentation

## 2018-02-01 DIAGNOSIS — K219 Gastro-esophageal reflux disease without esophagitis: Secondary | ICD-10-CM | POA: Insufficient documentation

## 2018-02-01 DIAGNOSIS — M810 Age-related osteoporosis without current pathological fracture: Secondary | ICD-10-CM | POA: Diagnosis present

## 2018-02-01 DIAGNOSIS — Z79899 Other long term (current) drug therapy: Secondary | ICD-10-CM | POA: Insufficient documentation

## 2018-02-01 DIAGNOSIS — E7800 Pure hypercholesterolemia, unspecified: Secondary | ICD-10-CM | POA: Insufficient documentation

## 2018-02-01 HISTORY — PX: PARATHYROIDECTOMY: SHX19

## 2018-02-01 LAB — GLUCOSE, CAPILLARY
GLUCOSE-CAPILLARY: 106 mg/dL — AB (ref 70–99)
Glucose-Capillary: 117 mg/dL — ABNORMAL HIGH (ref 70–99)

## 2018-02-01 SURGERY — PARATHYROIDECTOMY
Anesthesia: General | Site: Neck | Laterality: Right

## 2018-02-01 MED ORDER — SODIUM CHLORIDE 0.9 % IR SOLN
Status: DC | PRN
  Administered 2018-02-01: 1000 mL

## 2018-02-01 MED ORDER — ONDANSETRON HCL 4 MG/2ML IJ SOLN
4.0000 mg | Freq: Four times a day (QID) | INTRAMUSCULAR | Status: AC | PRN
  Administered 2018-02-01: 4 mg via INTRAVENOUS

## 2018-02-01 MED ORDER — LIDOCAINE 2% (20 MG/ML) 5 ML SYRINGE
INTRAMUSCULAR | Status: DC | PRN
  Administered 2018-02-01: 25 mg via INTRAVENOUS

## 2018-02-01 MED ORDER — PHENYLEPHRINE 40 MCG/ML (10ML) SYRINGE FOR IV PUSH (FOR BLOOD PRESSURE SUPPORT)
PREFILLED_SYRINGE | INTRAVENOUS | Status: AC
  Filled 2018-02-01: qty 10

## 2018-02-01 MED ORDER — DEXAMETHASONE SODIUM PHOSPHATE 10 MG/ML IJ SOLN
INTRAMUSCULAR | Status: AC
  Filled 2018-02-01: qty 1

## 2018-02-01 MED ORDER — ROCURONIUM BROMIDE 10 MG/ML (PF) SYRINGE
PREFILLED_SYRINGE | INTRAVENOUS | Status: AC
  Filled 2018-02-01: qty 10

## 2018-02-01 MED ORDER — PROMETHAZINE HCL 25 MG/ML IJ SOLN
6.2500 mg | INTRAMUSCULAR | Status: DC | PRN
  Administered 2018-02-01: 6.25 mg via INTRAVENOUS

## 2018-02-01 MED ORDER — CHLORHEXIDINE GLUCONATE CLOTH 2 % EX PADS: 6.0000 | MEDICATED_PAD | Freq: Once | CUTANEOUS | Status: DC

## 2018-02-01 MED ORDER — DEXAMETHASONE SODIUM PHOSPHATE 10 MG/ML IJ SOLN
INTRAMUSCULAR | Status: DC | PRN
  Administered 2018-02-01: 10 mg via INTRAVENOUS

## 2018-02-01 MED ORDER — ONDANSETRON HCL 4 MG/2ML IJ SOLN
INTRAMUSCULAR | Status: AC
  Filled 2018-02-01: qty 2

## 2018-02-01 MED ORDER — LACTATED RINGERS IV SOLN
INTRAVENOUS | Status: DC
  Administered 2018-02-01: 07:00:00 via INTRAVENOUS

## 2018-02-01 MED ORDER — ROCURONIUM BROMIDE 10 MG/ML (PF) SYRINGE
PREFILLED_SYRINGE | INTRAVENOUS | Status: DC | PRN
  Administered 2018-02-01: 40 mg via INTRAVENOUS

## 2018-02-01 MED ORDER — BUPIVACAINE HCL 0.25 % IJ SOLN
INTRAMUSCULAR | Status: DC | PRN
  Administered 2018-02-01: 10 mL

## 2018-02-01 MED ORDER — FENTANYL CITRATE (PF) 250 MCG/5ML IJ SOLN
INTRAMUSCULAR | Status: AC
  Filled 2018-02-01: qty 5

## 2018-02-01 MED ORDER — FENTANYL CITRATE (PF) 100 MCG/2ML IJ SOLN
INTRAMUSCULAR | Status: AC
  Filled 2018-02-01: qty 2

## 2018-02-01 MED ORDER — PHENYLEPHRINE 40 MCG/ML (10ML) SYRINGE FOR IV PUSH (FOR BLOOD PRESSURE SUPPORT)
PREFILLED_SYRINGE | INTRAVENOUS | Status: DC | PRN
  Administered 2018-02-01: 40 ug via INTRAVENOUS
  Administered 2018-02-01: 80 ug via INTRAVENOUS

## 2018-02-01 MED ORDER — OXYCODONE HCL 5 MG/5ML PO SOLN
5.0000 mg | Freq: Once | ORAL | Status: DC | PRN
  Filled 2018-02-01: qty 5

## 2018-02-01 MED ORDER — PROPOFOL 10 MG/ML IV BOLUS
INTRAVENOUS | Status: AC
  Filled 2018-02-01: qty 40

## 2018-02-01 MED ORDER — TRAMADOL HCL 50 MG PO TABS: 50.0000 mg | ORAL_TABLET | Freq: Once | ORAL | Status: DC

## 2018-02-01 MED ORDER — MIDAZOLAM HCL 2 MG/2ML IJ SOLN
INTRAMUSCULAR | Status: AC
  Filled 2018-02-01: qty 2

## 2018-02-01 MED ORDER — FENTANYL CITRATE (PF) 100 MCG/2ML IJ SOLN
25.0000 ug | INTRAMUSCULAR | Status: DC | PRN
  Administered 2018-02-01 (×2): 50 ug via INTRAVENOUS

## 2018-02-01 MED ORDER — HYDROMORPHONE HCL 1 MG/ML IJ SOLN
0.2500 mg | INTRAMUSCULAR | Status: DC | PRN
  Administered 2018-02-01 (×2): 0.5 mg via INTRAVENOUS

## 2018-02-01 MED ORDER — LIDOCAINE 2% (20 MG/ML) 5 ML SYRINGE
INTRAMUSCULAR | Status: AC
  Filled 2018-02-01: qty 5

## 2018-02-01 MED ORDER — SUGAMMADEX SODIUM 500 MG/5ML IV SOLN
INTRAVENOUS | Status: DC | PRN
  Administered 2018-02-01: 120 mg via INTRAVENOUS

## 2018-02-01 MED ORDER — CEFAZOLIN SODIUM-DEXTROSE 2-4 GM/100ML-% IV SOLN
2.0000 g | INTRAVENOUS | Status: AC
  Administered 2018-02-01: 2 g via INTRAVENOUS
  Filled 2018-02-01: qty 100

## 2018-02-01 MED ORDER — MIDAZOLAM HCL 2 MG/2ML IJ SOLN
INTRAMUSCULAR | Status: DC | PRN
  Administered 2018-02-01: 2 mg via INTRAVENOUS

## 2018-02-01 MED ORDER — PROPOFOL 10 MG/ML IV BOLUS
INTRAVENOUS | Status: DC | PRN
  Administered 2018-02-01: 160 mg via INTRAVENOUS

## 2018-02-01 MED ORDER — ONDANSETRON HCL 4 MG/2ML IJ SOLN
INTRAMUSCULAR | Status: DC | PRN
  Administered 2018-02-01: 4 mg via INTRAVENOUS

## 2018-02-01 MED ORDER — TRAMADOL HCL 50 MG PO TABS: 50.0000 mg | ORAL_TABLET | Freq: Four times a day (QID) | ORAL | 0 refills | Status: DC | PRN

## 2018-02-01 MED ORDER — FENTANYL CITRATE (PF) 250 MCG/5ML IJ SOLN
INTRAMUSCULAR | Status: DC | PRN
  Administered 2018-02-01: 25 ug via INTRAVENOUS
  Administered 2018-02-01: 50 ug via INTRAVENOUS
  Administered 2018-02-01: 100 ug via INTRAVENOUS

## 2018-02-01 MED ORDER — HYDROMORPHONE HCL 1 MG/ML IJ SOLN
INTRAMUSCULAR | Status: AC
  Filled 2018-02-01: qty 1

## 2018-02-01 MED ORDER — PROMETHAZINE HCL 25 MG/ML IJ SOLN
INTRAMUSCULAR | Status: AC
  Filled 2018-02-01: qty 1

## 2018-02-01 MED ORDER — SUGAMMADEX SODIUM 500 MG/5ML IV SOLN
INTRAVENOUS | Status: AC
  Filled 2018-02-01: qty 5

## 2018-02-01 MED ORDER — BUPIVACAINE HCL (PF) 0.25 % IJ SOLN
INTRAMUSCULAR | Status: AC
  Filled 2018-02-01: qty 30

## 2018-02-01 MED ORDER — OXYCODONE HCL 5 MG PO TABS: 5.0000 mg | ORAL_TABLET | Freq: Once | ORAL | Status: DC | PRN

## 2018-02-01 SURGICAL SUPPLY — 34 items
ATTRACTOMAT 16X20 MAGNETIC DRP (DRAPES) ×2 IMPLANT
BLADE HEX COATED 2.75 (ELECTRODE) ×2 IMPLANT
BLADE SURG 15 STRL LF DISP TIS (BLADE) ×1 IMPLANT
BLADE SURG 15 STRL SS (BLADE) ×1
CHLORAPREP W/TINT 26ML (MISCELLANEOUS) ×2 IMPLANT
CLIP VESOCCLUDE MED 6/CT (CLIP) ×4 IMPLANT
CLIP VESOCCLUDE SM WIDE 6/CT (CLIP) ×4 IMPLANT
DERMABOND ADVANCED (GAUZE/BANDAGES/DRESSINGS)
DERMABOND ADVANCED .7 DNX12 (GAUZE/BANDAGES/DRESSINGS) IMPLANT
DRAPE LAPAROTOMY T 98X78 PEDS (DRAPES) ×2 IMPLANT
ELECT PENCIL ROCKER SW 15FT (MISCELLANEOUS) ×2 IMPLANT
ELECT REM PT RETURN 15FT ADLT (MISCELLANEOUS) ×2 IMPLANT
GAUZE 4X4 16PLY RFD (DISPOSABLE) ×2 IMPLANT
GLOVE SURG ORTHO 8.0 STRL STRW (GLOVE) ×2 IMPLANT
GOWN STRL REUS W/TWL XL LVL3 (GOWN DISPOSABLE) ×6 IMPLANT
HEMOSTAT SURGICEL 2X4 FIBR (HEMOSTASIS) ×2 IMPLANT
ILLUMINATOR WAVEGUIDE N/F (MISCELLANEOUS) IMPLANT
KIT BASIN OR (CUSTOM PROCEDURE TRAY) ×2 IMPLANT
LIGHT WAVEGUIDE WIDE FLAT (MISCELLANEOUS) IMPLANT
NEEDLE HYPO 25X1 1.5 SAFETY (NEEDLE) ×2 IMPLANT
PACK BASIC VI WITH GOWN DISP (CUSTOM PROCEDURE TRAY) ×2 IMPLANT
POWDER SURGICEL 3.0 GRAM (HEMOSTASIS) IMPLANT
STRIP CLOSURE SKIN 1/2X4 (GAUZE/BANDAGES/DRESSINGS) ×2 IMPLANT
SUT MNCRL AB 4-0 PS2 18 (SUTURE) ×2 IMPLANT
SUT SILK 2 0 (SUTURE)
SUT SILK 2-0 18XBRD TIE 12 (SUTURE) IMPLANT
SUT SILK 3 0 (SUTURE)
SUT SILK 3-0 18XBRD TIE 12 (SUTURE) IMPLANT
SUT VIC AB 3-0 SH 18 (SUTURE) ×4 IMPLANT
SYR BULB IRRIGATION 50ML (SYRINGE) ×2 IMPLANT
SYR CONTROL 10ML LL (SYRINGE) ×2 IMPLANT
TOWEL OR 17X26 10 PK STRL BLUE (TOWEL DISPOSABLE) ×2 IMPLANT
TOWEL OR NON WOVEN STRL DISP B (DISPOSABLE) ×2 IMPLANT
YANKAUER SUCT BULB TIP 10FT TU (MISCELLANEOUS) ×2 IMPLANT

## 2018-02-01 NOTE — Interval H&P Note (Signed)
History and Physical Interval Note:  02/01/2018 8:14 AM  Beth Garcia  has presented today for surgery, with the diagnosis of PRIMARY HYPERPARATHYROIDISM.  The various methods of treatment have been discussed with the patient and family. After consideration of risks, benefits and other options for treatment, the patient has consented to    Procedure(s): RIGHT PARATHYROIDECTOMY (Right) as a surgical intervention .    The patient's history has been reviewed, patient examined, no change in status, stable for surgery.  I have reviewed the patient's chart and labs.  Questions were answered to the patient's satisfaction.    Armandina Gemma, [place] [facility]: [place]

## 2018-02-01 NOTE — Transfer of Care (Signed)
Immediate Anesthesia Transfer of Care Note  Patient: Beth Garcia  Procedure(s) Performed: RIGHT PARATHYROIDECTOMY (Right Neck)  Patient Location: PACU  Anesthesia Type:General  Level of Consciousness: awake, sedated and patient cooperative  Airway & Oxygen Therapy: Patient Spontanous Breathing and Patient connected to face mask oxygen  Post-op Assessment: Report given to RN, Post -op Vital signs reviewed and stable and Patient moving all extremities  Post vital signs: stable  Last Vitals:  Vitals Value Taken Time  BP 188/100 02/01/2018 10:03 AM  Temp    Pulse 59 02/01/2018 10:08 AM  Resp    SpO2 98 % 02/01/2018 10:08 AM  Vitals shown include unvalidated device data.  Last Pain:  Vitals:   02/01/18 0646  TempSrc: Oral         Complications: No apparent anesthesia complications

## 2018-02-01 NOTE — Op Note (Signed)
OPERATIVE REPORT - PARATHYROIDECTOMY  Preoperative diagnosis: Primary hyperparathyroidism  Postop diagnosis: Same  Procedure: Right inferior minimally invasive parathyroidectomy, biopsy of right superior parathyroid, biopsy of right thyroid lobe  Surgeon:  Armandina Gemma, MD  Anesthesia: General endotracheal  Estimated blood loss: Minimal  Preparation: ChloraPrep  Indications: Patient is referred by Dr. Gar Ponto and Dr. Bud Face for evaluation and management of suspected primary hyperparathyroidism. Patient has a long-standing history of hypercalcemia. This is being monitored by her primary care physician. She also has a distant history of hyperthyroidism for which she did not require additional treatment. Her most recent TSH level is normal at 1.23. She is not on any thyroid medications. Patient has had recent laboratory studies showing an elevated calcium level at 11.8 and an elevated intact PTH level at 155. 25-hydroxy vitamin D level was normal at 41. Patient does take vitamin D supplements. Patient had a recent bone density scan which unfortunately does show osteoporosis with a T score of -2.8. Patient underwent a nuclear medicine parathyroid scan on August 25, 2017. This was negative for parathyroid adenoma.  4D-CT scan suggests a right sided parathyroid adenoma.  Patient now comes to surgery for exploration.  Procedure: The patient was prepared in the pre-operative holding area. The patient was brought to the operating room and placed in a supine position on the operating room table. Following administration of general anesthesia, the patient was positioned and then prepped and draped in the usual strict aseptic fashion. After ascertaining that an adequate level of anesthesia been achieved, a neck incision was made with a #15 blade. Dissection was carried through subcutaneous tissues and platysma. Hemostasis was obtained with the electrocautery. Skin flaps were developed  circumferentially and a Weitlander retractor was placed for exposure.  Strap muscles were incised in the midline. Strap muscles were reflected lateralley exposing the thyroid lobe. With gentle blunt dissection the thyroid lobe was mobilized.  Dissection was carried through adipose tissue and an enlarged right inferior parathyroid gland was identified. It was gently mobilized. Vascular structures were divided between small and medium ligaclips. Care was taken to avoid the recurrent laryngeal nerve and the esophagus. The parathyroid gland was completely excised. It was submitted to pathology where frozen section confirmed parathyroid tissue consistent with adenoma.  Further exploration revealed a normal appearing right superior gland.  Biopsy confirmed parathyroid tissue.  The gland was marked with a medium ligaclip.  A palpable subcentimeter nodule was noted in the posterior right thyroid lobe.  This was excised with the cautery taking care to avoid the recurrent nerve.  It was submitted to pathology for permanent evaluation.  Neck was irrigated with warm saline and good hemostasis was noted. Fibrillar was placed in the operative field. Strap muscles were reapproximated in the midline with interrupted 3-0 Vicryl sutures. Platysma was closed with interrupted 3-0 Vicryl sutures. Skin was closed with a running 4-0 Monocryl subcuticular suture. Marcaine was infiltrated circumferentially. Wound was washed and dried and Steristrips were applied. Patient was awakened from anesthesia and brought to the recovery room. The patient tolerated the procedure well.   Armandina Gemma, MD [facility] Office: [phone]

## 2018-02-01 NOTE — Discharge Instructions (Addendum)
General Anesthesia, Adult, Care After These instructions provide you with information about caring for yourself after your procedure. Your health care provider may also give you more specific instructions. Your treatment has been planned according to current medical practices, but problems sometimes occur. Call your health care provider if you have any problems or questions after your procedure. What can I expect after the procedure? After the procedure, it is common to have:  Vomiting.  A sore throat.  Mental slowness.  It is common to feel:  Nauseous.  Cold or shivery.  Sleepy.  Tired.  Sore or achy, even in parts of your body where you did not have surgery.  Follow these instructions at home: For at least 24 hours after the procedure:  Do not: ? Participate in activities where you could fall or become injured. ? Drive. ? Use heavy machinery. ? Drink alcohol. ? Take sleeping pills or medicines that cause drowsiness. ? Make important decisions or sign legal documents. ? Take care of children on your own.  Rest. Eating and drinking  If you vomit, drink water, juice, or soup when you can drink without vomiting.  Drink enough fluid to keep your urine clear or pale yellow.  Make sure you have little or no nausea before eating solid foods.  Follow the diet recommended by your health care provider. General instructions  Have a responsible adult stay with you until you are awake and alert.  Return to your normal activities as told by your health care provider. Ask your health care provider what activities are safe for you.  Take over-the-counter and prescription medicines only as told by your health care provider.  If you smoke, do not smoke without supervision.  Keep all follow-up visits as told by your health care provider. This is important. Contact a health care provider if:  You continue to have nausea or vomiting at home, and medicines are not helpful.  You  cannot drink fluids or start eating again.  You cannot urinate after 8-12 hours.  You develop a skin rash.  You have fever.  You have increasing redness at the site of your procedure. Get help right away if:  You have difficulty breathing.  You have chest pain.  You have unexpected bleeding.  You feel that you are having a life-threatening or urgent problem. This information is not intended to replace advice given to you by your health care provider. Make sure you discuss any questions you have with your health care provider. Document Released: 09/26/2000 Document Revised: 11/23/2015 Document Reviewed: 06/04/2015 Elsevier Interactive Patient Education  2018 [place]  THYROID & PARATHYROID SURGERY:  POST-OP INSTRUCTIONS  Always review your discharge instruction sheet from the facility where your surgery was performed.  A prescription for pain medication may be given to you upon discharge.  Take your pain medication as prescribed.  If narcotic pain medicine is not needed, then you may take acetaminophen (Tylenol) or ibuprofen (Advil) as needed.  Take your usually prescribed medications unless otherwise directed.  If you need a refill on your pain medication, please contact our office during regular business hours.  Prescriptions cannot be processed by our office after 5 pm or on weekends.  Start with a light diet upon arrival home, such as soup and crackers or toast.  Be sure to drink plenty of fluids daily.  Resume your normal diet the day after surgery.  Most patients will experience some swelling and bruising on the chest and  neck area.  Ice packs will help.  Swelling and bruising can take several days to resolve.   It is common to experience some constipation after surgery.  Increasing fluid intake and taking a stool softener (Colace) will usually help or prevent this problem.  A mild laxative (Milk of Magnesia or Miralax) should be taken  according to package directions if there has been no bowel movement after 48 hours.  You have steri-strips and a gauze dressing over your incision.  You may remove the gauze bandage on the second day after surgery, and you may shower at that time.  Leave your steri-strips (small skin tapes) in place directly over the incision.  These strips should remain on the skin for 5-7 days and then be removed.  You may get them wet in the shower and pat them dry.  You may resume regular (light) daily activities beginning the next day (such as daily self-care, walking, climbing stairs) gradually increasing activities as tolerated.  You may have sexual intercourse when it is comfortable.  Refrain from any heavy lifting or straining until approved by your doctor.  You may drive when you no longer are taking prescription pain medication, you can comfortably wear a seatbelt, and you can safely maneuver your car and apply brakes.  You should see your doctor in the office for a follow-up appointment approximately three weeks after your surgery.  Make sure that you call for this appointment within a day or two after you arrive home to insure a convenient appointment time.  WHEN TO CALL YOUR DOCTOR: -- Fever greater than 101.5 -- Inability to urinate -- Nausea and/or vomiting - persistent -- Extreme swelling or bruising -- Continued bleeding from incision -- Increased pain, redness, or drainage from the incision -- Difficulty swallowing or breathing -- Muscle cramping or spasms -- Numbness or tingling in hands or around lips  The clinic staff is available to answer your questions during regular business hours.  Please dont hesitate to call and ask to speak to one of the nurses if you have concerns.  Armandina Gemma, MD [facility] Office: [phone]

## 2018-02-01 NOTE — Anesthesia Procedure Notes (Signed)
Procedure Name: Intubation Date/Time: 02/01/2018 8:45 AM Performed by: Pilar Grammes, CRNA Pre-anesthesia Checklist: Patient identified, Emergency Drugs available, Suction available, Patient being monitored and Timeout performed Patient Re-evaluated:Patient Re-evaluated prior to induction Oxygen Delivery Method: Circle system utilized Preoxygenation: Pre-oxygenation with 100% oxygen Induction Type: IV induction and Cricoid Pressure applied Ventilation: Mask ventilation without difficulty Laryngoscope Size: Miller and 2 Grade View: Grade II Tube type: Oral Tube size: 7.0 mm Number of attempts: 1 Airway Equipment and Method: Stylet Placement Confirmation: positive ETCO2,  ETT inserted through vocal cords under direct vision,  CO2 detector and breath sounds checked- equal and bilateral Secured at: 21 cm Tube secured with: Tape Dental Injury: Teeth and Oropharynx as per pre-operative assessment  Comments: Anterior trach but easy mask and intubated on first attempt.   Cricoid pressure needed.

## 2018-02-01 NOTE — OR Nursing (Signed)
Reviewed d/c instructions with spouse, verbalized understanding, RX given to spouse. VS stable, Denies pain, NAD at this time.

## 2018-02-01 NOTE — Anesthesia Preprocedure Evaluation (Signed)
Anesthesia Evaluation  Patient identified by MRN, date of birth, ID band Patient awake    Reviewed: Allergy & Precautions, H&P , NPO status , Patient's Chart, lab work & pertinent test results  Airway Mallampati: II   Neck ROM: full    Dental   Pulmonary neg pulmonary ROS,    breath sounds clear to auscultation       Cardiovascular hypertension,  Rhythm:regular Rate:Normal     Neuro/Psych    GI/Hepatic GERD  ,  Endo/Other  diabetes, Type 2Hyperthyroidism   Renal/GU stones     Musculoskeletal  (+) Arthritis ,   Abdominal   Peds  Hematology   Anesthesia Other Findings   Reproductive/Obstetrics                             Anesthesia Physical Anesthesia Plan  ASA: II  Anesthesia Plan: General   Post-op Pain Management:    Induction: Intravenous  PONV Risk Score and Plan: 3 and Ondansetron, Dexamethasone, Midazolam, Treatment may vary due to age or medical condition and Scopolamine patch - Pre-op  Airway Management Planned: Oral ETT  Additional Equipment:   Intra-op Plan:   Post-operative Plan: Extubation in OR  Informed Consent: I have reviewed the patients History and Physical, chart, labs and discussed the procedure including the risks, benefits and alternatives for the proposed anesthesia with the patient or authorized representative who has indicated his/her understanding and acceptance.     Plan Discussed with: CRNA, Anesthesiologist and Surgeon  Anesthesia Plan Comments:         Anesthesia Quick Evaluation

## 2018-02-01 NOTE — Anesthesia Postprocedure Evaluation (Signed)
Anesthesia Post Note  Patient: Beth Garcia  Procedure(s) Performed: RIGHT PARATHYROIDECTOMY (Right Neck)     Patient location during evaluation: PACU Anesthesia Type: General Level of consciousness: awake and alert Pain management: pain level controlled Vital Signs Assessment: post-procedure vital signs reviewed and stable Respiratory status: spontaneous breathing, nonlabored ventilation, respiratory function stable and patient connected to nasal cannula oxygen Cardiovascular status: blood pressure returned to baseline and stable Postop Assessment: no apparent nausea or vomiting Anesthetic complications: no    Last Vitals:  Vitals:   02/01/18 1245 02/01/18 1315  BP:  (!) 152/87  Pulse:    Resp:  15  Temp: 36.5 C 36.6 C  SpO2:  96%    Last Pain:  Vitals:   02/01/18 1245  TempSrc:   PainSc: [name]

## 2018-02-02 ENCOUNTER — Encounter (HOSPITAL_COMMUNITY): Payer: Self-pay | Admitting: Surgery

## 2018-02-02 NOTE — Progress Notes (Signed)
Please contact patient and notify of benign pathology results.  Dai Mcadams M. Verna Desrocher, MD, FACS [facility] Office: [phone]   

## 2018-06-08 DIAGNOSIS — Z6824 Body mass index (BMI) 24.0-24.9, adult: Secondary | ICD-10-CM | POA: Diagnosis not present

## 2018-06-08 DIAGNOSIS — M47812 Spondylosis without myelopathy or radiculopathy, cervical region: Secondary | ICD-10-CM | POA: Diagnosis not present

## 2018-06-08 DIAGNOSIS — E1165 Type 2 diabetes mellitus with hyperglycemia: Secondary | ICD-10-CM | POA: Diagnosis not present

## 2018-06-08 DIAGNOSIS — M5412 Radiculopathy, cervical region: Secondary | ICD-10-CM | POA: Diagnosis not present

## 2018-06-14 DIAGNOSIS — H2513 Age-related nuclear cataract, bilateral: Secondary | ICD-10-CM | POA: Diagnosis not present

## 2018-06-18 DIAGNOSIS — M4802 Spinal stenosis, cervical region: Secondary | ICD-10-CM | POA: Diagnosis not present

## 2018-06-18 DIAGNOSIS — M40202 Unspecified kyphosis, cervical region: Secondary | ICD-10-CM | POA: Diagnosis not present

## 2018-06-18 DIAGNOSIS — M5412 Radiculopathy, cervical region: Secondary | ICD-10-CM | POA: Diagnosis not present

## 2018-06-18 DIAGNOSIS — M47812 Spondylosis without myelopathy or radiculopathy, cervical region: Secondary | ICD-10-CM | POA: Diagnosis not present

## 2018-06-21 DIAGNOSIS — M5033 Other cervical disc degeneration, cervicothoracic region: Secondary | ICD-10-CM | POA: Diagnosis not present

## 2018-06-21 DIAGNOSIS — M9902 Segmental and somatic dysfunction of thoracic region: Secondary | ICD-10-CM | POA: Diagnosis not present

## 2018-06-25 DIAGNOSIS — M5033 Other cervical disc degeneration, cervicothoracic region: Secondary | ICD-10-CM | POA: Diagnosis not present

## 2018-06-25 DIAGNOSIS — M9902 Segmental and somatic dysfunction of thoracic region: Secondary | ICD-10-CM | POA: Diagnosis not present

## 2018-07-02 DIAGNOSIS — M9902 Segmental and somatic dysfunction of thoracic region: Secondary | ICD-10-CM | POA: Diagnosis not present

## 2018-07-02 DIAGNOSIS — M5033 Other cervical disc degeneration, cervicothoracic region: Secondary | ICD-10-CM | POA: Diagnosis not present

## 2018-07-03 DIAGNOSIS — M9902 Segmental and somatic dysfunction of thoracic region: Secondary | ICD-10-CM | POA: Diagnosis not present

## 2018-07-03 DIAGNOSIS — M5033 Other cervical disc degeneration, cervicothoracic region: Secondary | ICD-10-CM | POA: Diagnosis not present

## 2018-07-05 DIAGNOSIS — M5033 Other cervical disc degeneration, cervicothoracic region: Secondary | ICD-10-CM | POA: Diagnosis not present

## 2018-07-05 DIAGNOSIS — M9902 Segmental and somatic dysfunction of thoracic region: Secondary | ICD-10-CM | POA: Diagnosis not present

## 2018-07-09 DIAGNOSIS — L659 Nonscarring hair loss, unspecified: Secondary | ICD-10-CM | POA: Diagnosis not present

## 2018-07-09 DIAGNOSIS — Z6824 Body mass index (BMI) 24.0-24.9, adult: Secondary | ICD-10-CM | POA: Diagnosis not present

## 2018-07-09 DIAGNOSIS — K219 Gastro-esophageal reflux disease without esophagitis: Secondary | ICD-10-CM | POA: Diagnosis not present

## 2018-07-09 DIAGNOSIS — E782 Mixed hyperlipidemia: Secondary | ICD-10-CM | POA: Diagnosis not present

## 2018-07-09 DIAGNOSIS — I10 Essential (primary) hypertension: Secondary | ICD-10-CM | POA: Diagnosis not present

## 2018-07-09 DIAGNOSIS — M5033 Other cervical disc degeneration, cervicothoracic region: Secondary | ICD-10-CM | POA: Diagnosis not present

## 2018-07-09 DIAGNOSIS — Z0001 Encounter for general adult medical examination with abnormal findings: Secondary | ICD-10-CM | POA: Diagnosis not present

## 2018-07-09 DIAGNOSIS — M9902 Segmental and somatic dysfunction of thoracic region: Secondary | ICD-10-CM | POA: Diagnosis not present

## 2018-07-09 DIAGNOSIS — Z23 Encounter for immunization: Secondary | ICD-10-CM | POA: Diagnosis not present

## 2018-07-09 DIAGNOSIS — E1165 Type 2 diabetes mellitus with hyperglycemia: Secondary | ICD-10-CM | POA: Diagnosis not present

## 2018-07-09 DIAGNOSIS — R002 Palpitations: Secondary | ICD-10-CM | POA: Diagnosis not present

## 2018-07-11 DIAGNOSIS — M9902 Segmental and somatic dysfunction of thoracic region: Secondary | ICD-10-CM | POA: Diagnosis not present

## 2018-07-11 DIAGNOSIS — M5033 Other cervical disc degeneration, cervicothoracic region: Secondary | ICD-10-CM | POA: Diagnosis not present

## 2018-07-12 DIAGNOSIS — M5033 Other cervical disc degeneration, cervicothoracic region: Secondary | ICD-10-CM | POA: Diagnosis not present

## 2018-07-12 DIAGNOSIS — M9902 Segmental and somatic dysfunction of thoracic region: Secondary | ICD-10-CM | POA: Diagnosis not present

## 2018-07-16 DIAGNOSIS — M5033 Other cervical disc degeneration, cervicothoracic region: Secondary | ICD-10-CM | POA: Diagnosis not present

## 2018-07-16 DIAGNOSIS — M9902 Segmental and somatic dysfunction of thoracic region: Secondary | ICD-10-CM | POA: Diagnosis not present

## 2018-07-18 DIAGNOSIS — M9902 Segmental and somatic dysfunction of thoracic region: Secondary | ICD-10-CM | POA: Diagnosis not present

## 2018-07-18 DIAGNOSIS — M5033 Other cervical disc degeneration, cervicothoracic region: Secondary | ICD-10-CM | POA: Diagnosis not present

## 2018-07-19 DIAGNOSIS — M5033 Other cervical disc degeneration, cervicothoracic region: Secondary | ICD-10-CM | POA: Diagnosis not present

## 2018-07-19 DIAGNOSIS — M9902 Segmental and somatic dysfunction of thoracic region: Secondary | ICD-10-CM | POA: Diagnosis not present

## 2018-07-23 DIAGNOSIS — M5033 Other cervical disc degeneration, cervicothoracic region: Secondary | ICD-10-CM | POA: Diagnosis not present

## 2018-07-23 DIAGNOSIS — M9902 Segmental and somatic dysfunction of thoracic region: Secondary | ICD-10-CM | POA: Diagnosis not present

## 2018-07-25 DIAGNOSIS — M9902 Segmental and somatic dysfunction of thoracic region: Secondary | ICD-10-CM | POA: Diagnosis not present

## 2018-07-25 DIAGNOSIS — M5033 Other cervical disc degeneration, cervicothoracic region: Secondary | ICD-10-CM | POA: Diagnosis not present

## 2018-08-02 DIAGNOSIS — E1165 Type 2 diabetes mellitus with hyperglycemia: Secondary | ICD-10-CM | POA: Diagnosis not present

## 2018-08-02 DIAGNOSIS — M810 Age-related osteoporosis without current pathological fracture: Secondary | ICD-10-CM | POA: Diagnosis not present

## 2018-08-02 DIAGNOSIS — M5030 Other cervical disc degeneration, unspecified cervical region: Secondary | ICD-10-CM | POA: Diagnosis not present

## 2018-08-14 DIAGNOSIS — M5030 Other cervical disc degeneration, unspecified cervical region: Secondary | ICD-10-CM | POA: Diagnosis not present

## 2018-08-17 DIAGNOSIS — E782 Mixed hyperlipidemia: Secondary | ICD-10-CM | POA: Diagnosis not present

## 2018-08-17 DIAGNOSIS — E1165 Type 2 diabetes mellitus with hyperglycemia: Secondary | ICD-10-CM | POA: Diagnosis not present

## 2018-08-17 DIAGNOSIS — K219 Gastro-esophageal reflux disease without esophagitis: Secondary | ICD-10-CM | POA: Diagnosis not present

## 2018-08-17 DIAGNOSIS — I10 Essential (primary) hypertension: Secondary | ICD-10-CM | POA: Diagnosis not present

## 2018-08-20 DIAGNOSIS — K219 Gastro-esophageal reflux disease without esophagitis: Secondary | ICD-10-CM | POA: Diagnosis not present

## 2018-08-20 DIAGNOSIS — I10 Essential (primary) hypertension: Secondary | ICD-10-CM | POA: Diagnosis not present

## 2018-08-20 DIAGNOSIS — L659 Nonscarring hair loss, unspecified: Secondary | ICD-10-CM | POA: Diagnosis not present

## 2018-08-20 DIAGNOSIS — E782 Mixed hyperlipidemia: Secondary | ICD-10-CM | POA: Diagnosis not present

## 2018-08-20 DIAGNOSIS — E1169 Type 2 diabetes mellitus with other specified complication: Secondary | ICD-10-CM | POA: Diagnosis not present

## 2018-08-20 DIAGNOSIS — M810 Age-related osteoporosis without current pathological fracture: Secondary | ICD-10-CM | POA: Diagnosis not present

## 2018-08-20 DIAGNOSIS — R002 Palpitations: Secondary | ICD-10-CM | POA: Diagnosis not present

## 2018-08-20 DIAGNOSIS — Z6823 Body mass index (BMI) 23.0-23.9, adult: Secondary | ICD-10-CM | POA: Diagnosis not present

## 2018-09-28 DIAGNOSIS — M5412 Radiculopathy, cervical region: Secondary | ICD-10-CM | POA: Diagnosis not present

## 2018-10-10 DIAGNOSIS — M5412 Radiculopathy, cervical region: Secondary | ICD-10-CM | POA: Diagnosis not present

## 2019-02-19 DIAGNOSIS — M5030 Other cervical disc degeneration, unspecified cervical region: Secondary | ICD-10-CM | POA: Diagnosis not present

## 2019-03-14 DIAGNOSIS — Z23 Encounter for immunization: Secondary | ICD-10-CM | POA: Diagnosis not present

## 2019-03-15 DIAGNOSIS — E210 Primary hyperparathyroidism: Secondary | ICD-10-CM | POA: Diagnosis not present

## 2019-03-15 DIAGNOSIS — E559 Vitamin D deficiency, unspecified: Secondary | ICD-10-CM | POA: Diagnosis not present

## 2019-03-15 DIAGNOSIS — Z0001 Encounter for general adult medical examination with abnormal findings: Secondary | ICD-10-CM | POA: Diagnosis not present

## 2019-03-15 DIAGNOSIS — E1165 Type 2 diabetes mellitus with hyperglycemia: Secondary | ICD-10-CM | POA: Diagnosis not present

## 2019-03-15 DIAGNOSIS — K219 Gastro-esophageal reflux disease without esophagitis: Secondary | ICD-10-CM | POA: Diagnosis not present

## 2019-03-15 DIAGNOSIS — E782 Mixed hyperlipidemia: Secondary | ICD-10-CM | POA: Diagnosis not present

## 2019-03-15 DIAGNOSIS — I10 Essential (primary) hypertension: Secondary | ICD-10-CM | POA: Diagnosis not present

## 2019-03-20 DIAGNOSIS — K219 Gastro-esophageal reflux disease without esophagitis: Secondary | ICD-10-CM | POA: Diagnosis not present

## 2019-03-20 DIAGNOSIS — L659 Nonscarring hair loss, unspecified: Secondary | ICD-10-CM | POA: Diagnosis not present

## 2019-03-20 DIAGNOSIS — E1169 Type 2 diabetes mellitus with other specified complication: Secondary | ICD-10-CM | POA: Diagnosis not present

## 2019-03-20 DIAGNOSIS — Z6824 Body mass index (BMI) 24.0-24.9, adult: Secondary | ICD-10-CM | POA: Diagnosis not present

## 2019-03-20 DIAGNOSIS — M810 Age-related osteoporosis without current pathological fracture: Secondary | ICD-10-CM | POA: Diagnosis not present

## 2019-03-20 DIAGNOSIS — E782 Mixed hyperlipidemia: Secondary | ICD-10-CM | POA: Diagnosis not present

## 2019-03-20 DIAGNOSIS — I10 Essential (primary) hypertension: Secondary | ICD-10-CM | POA: Diagnosis not present

## 2019-03-20 DIAGNOSIS — R002 Palpitations: Secondary | ICD-10-CM | POA: Diagnosis not present

## 2019-04-03 DIAGNOSIS — E782 Mixed hyperlipidemia: Secondary | ICD-10-CM | POA: Diagnosis not present

## 2019-04-03 DIAGNOSIS — I10 Essential (primary) hypertension: Secondary | ICD-10-CM | POA: Diagnosis not present

## 2019-08-07 DIAGNOSIS — Z23 Encounter for immunization: Secondary | ICD-10-CM | POA: Diagnosis not present

## 2019-09-04 DIAGNOSIS — Z23 Encounter for immunization: Secondary | ICD-10-CM | POA: Diagnosis not present

## 2019-09-12 DIAGNOSIS — M5030 Other cervical disc degeneration, unspecified cervical region: Secondary | ICD-10-CM | POA: Diagnosis not present

## 2019-09-16 DIAGNOSIS — E782 Mixed hyperlipidemia: Secondary | ICD-10-CM | POA: Diagnosis not present

## 2019-09-16 DIAGNOSIS — E1165 Type 2 diabetes mellitus with hyperglycemia: Secondary | ICD-10-CM | POA: Diagnosis not present

## 2019-09-16 DIAGNOSIS — E210 Primary hyperparathyroidism: Secondary | ICD-10-CM | POA: Diagnosis not present

## 2019-09-16 DIAGNOSIS — I10 Essential (primary) hypertension: Secondary | ICD-10-CM | POA: Diagnosis not present

## 2019-09-16 DIAGNOSIS — K219 Gastro-esophageal reflux disease without esophagitis: Secondary | ICD-10-CM | POA: Diagnosis not present

## 2019-09-18 DIAGNOSIS — K219 Gastro-esophageal reflux disease without esophagitis: Secondary | ICD-10-CM | POA: Diagnosis not present

## 2019-09-18 DIAGNOSIS — E1169 Type 2 diabetes mellitus with other specified complication: Secondary | ICD-10-CM | POA: Diagnosis not present

## 2019-09-18 DIAGNOSIS — I10 Essential (primary) hypertension: Secondary | ICD-10-CM | POA: Diagnosis not present

## 2019-09-18 DIAGNOSIS — E782 Mixed hyperlipidemia: Secondary | ICD-10-CM | POA: Diagnosis not present

## 2019-09-18 DIAGNOSIS — R002 Palpitations: Secondary | ICD-10-CM | POA: Diagnosis not present

## 2019-09-18 DIAGNOSIS — L659 Nonscarring hair loss, unspecified: Secondary | ICD-10-CM | POA: Diagnosis not present

## 2019-09-18 DIAGNOSIS — Z6825 Body mass index (BMI) 25.0-25.9, adult: Secondary | ICD-10-CM | POA: Diagnosis not present

## 2019-09-18 DIAGNOSIS — M810 Age-related osteoporosis without current pathological fracture: Secondary | ICD-10-CM | POA: Diagnosis not present

## 2020-01-17 DIAGNOSIS — R509 Fever, unspecified: Secondary | ICD-10-CM | POA: Diagnosis not present

## 2020-01-17 DIAGNOSIS — Z20828 Contact with and (suspected) exposure to other viral communicable diseases: Secondary | ICD-10-CM | POA: Diagnosis not present

## 2020-01-17 DIAGNOSIS — J019 Acute sinusitis, unspecified: Secondary | ICD-10-CM | POA: Diagnosis not present

## 2020-03-18 DIAGNOSIS — D529 Folate deficiency anemia, unspecified: Secondary | ICD-10-CM | POA: Diagnosis not present

## 2020-03-18 DIAGNOSIS — E1165 Type 2 diabetes mellitus with hyperglycemia: Secondary | ICD-10-CM | POA: Diagnosis not present

## 2020-03-18 DIAGNOSIS — E782 Mixed hyperlipidemia: Secondary | ICD-10-CM | POA: Diagnosis not present

## 2020-03-18 DIAGNOSIS — M5090 Cervical disc disorder, unspecified, unspecified cervical region: Secondary | ICD-10-CM | POA: Diagnosis not present

## 2020-03-18 DIAGNOSIS — I10 Essential (primary) hypertension: Secondary | ICD-10-CM | POA: Diagnosis not present

## 2020-03-18 DIAGNOSIS — E210 Primary hyperparathyroidism: Secondary | ICD-10-CM | POA: Diagnosis not present

## 2020-03-18 DIAGNOSIS — K219 Gastro-esophageal reflux disease without esophagitis: Secondary | ICD-10-CM | POA: Diagnosis not present

## 2020-03-18 DIAGNOSIS — D510 Vitamin B12 deficiency anemia due to intrinsic factor deficiency: Secondary | ICD-10-CM | POA: Diagnosis not present

## 2020-03-18 DIAGNOSIS — D649 Anemia, unspecified: Secondary | ICD-10-CM | POA: Diagnosis not present

## 2020-03-24 DIAGNOSIS — Z1212 Encounter for screening for malignant neoplasm of rectum: Secondary | ICD-10-CM | POA: Diagnosis not present

## 2020-03-24 DIAGNOSIS — E782 Mixed hyperlipidemia: Secondary | ICD-10-CM | POA: Diagnosis not present

## 2020-03-24 DIAGNOSIS — R002 Palpitations: Secondary | ICD-10-CM | POA: Diagnosis not present

## 2020-03-24 DIAGNOSIS — E1169 Type 2 diabetes mellitus with other specified complication: Secondary | ICD-10-CM | POA: Diagnosis not present

## 2020-03-24 DIAGNOSIS — K219 Gastro-esophageal reflux disease without esophagitis: Secondary | ICD-10-CM | POA: Diagnosis not present

## 2020-03-24 DIAGNOSIS — I10 Essential (primary) hypertension: Secondary | ICD-10-CM | POA: Diagnosis not present

## 2020-03-24 DIAGNOSIS — M810 Age-related osteoporosis without current pathological fracture: Secondary | ICD-10-CM | POA: Diagnosis not present

## 2020-03-24 DIAGNOSIS — D509 Iron deficiency anemia, unspecified: Secondary | ICD-10-CM | POA: Diagnosis not present

## 2020-04-04 DIAGNOSIS — Z20828 Contact with and (suspected) exposure to other viral communicable diseases: Secondary | ICD-10-CM | POA: Diagnosis not present

## 2020-04-04 DIAGNOSIS — J019 Acute sinusitis, unspecified: Secondary | ICD-10-CM | POA: Diagnosis not present

## 2020-05-05 DIAGNOSIS — D520 Dietary folate deficiency anemia: Secondary | ICD-10-CM | POA: Diagnosis not present

## 2020-05-05 DIAGNOSIS — Z130 Encounter for screening for diseases of the blood and blood-forming organs and certain disorders involving the immune mechanism: Secondary | ICD-10-CM | POA: Diagnosis not present

## 2020-05-05 DIAGNOSIS — I10 Essential (primary) hypertension: Secondary | ICD-10-CM | POA: Diagnosis not present

## 2020-05-05 DIAGNOSIS — D519 Vitamin B12 deficiency anemia, unspecified: Secondary | ICD-10-CM | POA: Diagnosis not present

## 2020-05-05 DIAGNOSIS — D509 Iron deficiency anemia, unspecified: Secondary | ICD-10-CM | POA: Diagnosis not present

## 2020-05-06 DIAGNOSIS — Z23 Encounter for immunization: Secondary | ICD-10-CM | POA: Diagnosis not present

## 2020-05-26 DIAGNOSIS — Z23 Encounter for immunization: Secondary | ICD-10-CM | POA: Diagnosis not present

## 2020-06-01 DIAGNOSIS — J209 Acute bronchitis, unspecified: Secondary | ICD-10-CM | POA: Diagnosis not present

## 2020-06-01 DIAGNOSIS — Z20828 Contact with and (suspected) exposure to other viral communicable diseases: Secondary | ICD-10-CM | POA: Diagnosis not present

## 2020-06-01 DIAGNOSIS — R059 Cough, unspecified: Secondary | ICD-10-CM | POA: Diagnosis not present

## 2020-06-12 DIAGNOSIS — H524 Presbyopia: Secondary | ICD-10-CM | POA: Diagnosis not present

## 2020-06-12 DIAGNOSIS — H52221 Regular astigmatism, right eye: Secondary | ICD-10-CM | POA: Diagnosis not present

## 2020-06-12 DIAGNOSIS — H5201 Hypermetropia, right eye: Secondary | ICD-10-CM | POA: Diagnosis not present

## 2020-06-12 DIAGNOSIS — H2513 Age-related nuclear cataract, bilateral: Secondary | ICD-10-CM | POA: Diagnosis not present

## 2020-07-01 DIAGNOSIS — D509 Iron deficiency anemia, unspecified: Secondary | ICD-10-CM | POA: Diagnosis not present

## 2020-07-01 DIAGNOSIS — Z0001 Encounter for general adult medical examination with abnormal findings: Secondary | ICD-10-CM | POA: Diagnosis not present

## 2020-07-01 DIAGNOSIS — Z130 Encounter for screening for diseases of the blood and blood-forming organs and certain disorders involving the immune mechanism: Secondary | ICD-10-CM | POA: Diagnosis not present

## 2020-07-01 DIAGNOSIS — R6889 Other general symptoms and signs: Secondary | ICD-10-CM | POA: Diagnosis not present

## 2020-07-06 DIAGNOSIS — M810 Age-related osteoporosis without current pathological fracture: Secondary | ICD-10-CM | POA: Diagnosis not present

## 2020-07-06 DIAGNOSIS — R079 Chest pain, unspecified: Secondary | ICD-10-CM | POA: Diagnosis not present

## 2020-07-06 DIAGNOSIS — Z6827 Body mass index (BMI) 27.0-27.9, adult: Secondary | ICD-10-CM | POA: Diagnosis not present

## 2020-07-06 DIAGNOSIS — D529 Folate deficiency anemia, unspecified: Secondary | ICD-10-CM | POA: Diagnosis not present

## 2020-07-06 DIAGNOSIS — D519 Vitamin B12 deficiency anemia, unspecified: Secondary | ICD-10-CM | POA: Diagnosis not present

## 2020-07-06 DIAGNOSIS — E782 Mixed hyperlipidemia: Secondary | ICD-10-CM | POA: Diagnosis not present

## 2020-07-06 DIAGNOSIS — D509 Iron deficiency anemia, unspecified: Secondary | ICD-10-CM | POA: Diagnosis not present

## 2020-07-06 DIAGNOSIS — E1169 Type 2 diabetes mellitus with other specified complication: Secondary | ICD-10-CM | POA: Diagnosis not present

## 2020-07-06 DIAGNOSIS — Z23 Encounter for immunization: Secondary | ICD-10-CM | POA: Diagnosis not present

## 2020-07-06 DIAGNOSIS — R002 Palpitations: Secondary | ICD-10-CM | POA: Diagnosis not present

## 2020-07-06 DIAGNOSIS — I10 Essential (primary) hypertension: Secondary | ICD-10-CM | POA: Diagnosis not present

## 2020-07-10 DIAGNOSIS — R079 Chest pain, unspecified: Secondary | ICD-10-CM | POA: Diagnosis not present

## 2020-07-14 ENCOUNTER — Encounter (INDEPENDENT_AMBULATORY_CARE_PROVIDER_SITE_OTHER): Payer: Self-pay | Admitting: *Deleted

## 2020-07-14 DIAGNOSIS — D649 Anemia, unspecified: Secondary | ICD-10-CM | POA: Diagnosis not present

## 2020-07-21 DIAGNOSIS — D649 Anemia, unspecified: Secondary | ICD-10-CM | POA: Diagnosis not present

## 2020-07-23 ENCOUNTER — Other Ambulatory Visit: Payer: Self-pay

## 2020-07-23 ENCOUNTER — Encounter (INDEPENDENT_AMBULATORY_CARE_PROVIDER_SITE_OTHER): Payer: Self-pay

## 2020-07-23 ENCOUNTER — Other Ambulatory Visit (INDEPENDENT_AMBULATORY_CARE_PROVIDER_SITE_OTHER): Payer: Self-pay

## 2020-07-23 ENCOUNTER — Ambulatory Visit (INDEPENDENT_AMBULATORY_CARE_PROVIDER_SITE_OTHER): Payer: Medicare Other | Admitting: Gastroenterology

## 2020-07-23 ENCOUNTER — Encounter (INDEPENDENT_AMBULATORY_CARE_PROVIDER_SITE_OTHER): Payer: Self-pay | Admitting: Gastroenterology

## 2020-07-23 ENCOUNTER — Telehealth (INDEPENDENT_AMBULATORY_CARE_PROVIDER_SITE_OTHER): Payer: Self-pay

## 2020-07-23 DIAGNOSIS — D509 Iron deficiency anemia, unspecified: Secondary | ICD-10-CM | POA: Insufficient documentation

## 2020-07-23 DIAGNOSIS — Z1211 Encounter for screening for malignant neoplasm of colon: Secondary | ICD-10-CM

## 2020-07-23 DIAGNOSIS — R1013 Epigastric pain: Secondary | ICD-10-CM | POA: Insufficient documentation

## 2020-07-23 MED ORDER — SUTAB 1479-225-188 MG PO TABS: 188.0000 mg | ORAL_TABLET | Freq: Two times a day (BID) | ORAL | 0 refills | Status: DC

## 2020-07-23 NOTE — Telephone Encounter (Signed)
Beth Garcia, CMA  

## 2020-07-23 NOTE — H&P (View-Only) (Signed)
Maylon Peppers, M.D. Gastroenterology & Hepatology [facility] [address] [place], [place] [address] Primary Care Physician: Caryl Bis, MD [place] [id number]  Referring MD: PCP  Chief Complaint: iron deficiency anemia  History of Present Illness: Beth Garcia is a 70 y.o. female with PMH GERD, DM, HTN, HLD, hyperparathyroidism s/p gland resection, who presents for evaluation of iron deficiency anemia.  Patient reports that in September 2021 she was found to have new onset having worsening iron deficiency. She reports that she was put on oral iron but she developed diarrhea. She reports she has has been infused with iron x2 in the last 2 weeks, never required a blood transfusion.  The patient denies having any nausea, vomiting, fever, chills, hematochezia, melena, hematemesis, abdominal distention, diarrhea, jaundice, pruritus, odynophagia, dysphagia or weight loss.  Patient brings records from previous evaluation on 07/06/2020 which showed hemoglobin of 9.5, MCV 77, platelets 250, vesicle 9.2, iron 47, ferritin 6, iron saturation 10%, TIBC 458.  Folic acid was normal at 16 and vitamin B12 was 349 normal.  She had negative H. pylori IgG and lipase was normal 34.  Patient states that a month ago she noticed epigastric abdominal discomfort that does not radiate anywhere else. She reports that she started taking omeprazole 40 mg every day, which has resolved her discomfort. Noticed that when she puts lemon juice in her drinks she has worsening discomfort.  Denies intake of NSAIDs, anticoagulants, high dose aspirin, or any other antiplatelet.  Patient is a retired [employer] scientist.  Patient reports she had a normal stress test last Friday for evaluation of episodes of chest pain and neck pain. She was told that she was OK to undergo an endoscopic investigation.  Last EGD: never Last Colonoscopy:>10 years ago, had a few polyps removed  per the patient  FHx: neg for any gastrointestinal/liver disease, no malignancies Social: neg smoking, alcohol or illicit drug use Surgical: hysterectomy  Past Medical History: Past Medical History:  Diagnosis Date  . Arthritis    Hands  . Diabetes mellitus, type II ([place])    diet and exercise controlled  . Elevated temperature    Notmal temp is 99-100  . GERD (gastroesophageal reflux disease)   . History of kidney stones   . Hypercalcemia   . Hyperlipidemia   . Hypertension   . Hyperthyroidism    Mild, remote history  . Nephrolithiasis   . Vitamin D deficiency     Past Surgical History: Past Surgical History:  Procedure Laterality Date  . ABDOMINAL HYSTERECTOMY    . bilateral breast biopsy    . COLONOSCOPY W/ POLYPECTOMY    . PARATHYROIDECTOMY Right 02/01/2018   Procedure: RIGHT PARATHYROIDECTOMY;  Surgeon: Armandina Gemma, MD;  Location: [facility];  Service: General;  Laterality: Right;  . TONSILLECTOMY      Family History: Family History  Problem Relation Age of Onset  . Hypertension Mother   . Hyperlipidemia Mother   . Hyperlipidemia Father   . Stroke Father   . Cancer Father   . Hypertension Sister   . Hyperlipidemia Sister   . Hypertension Brother   . Hyperlipidemia Brother     Social History: Social History   Tobacco Use  Smoking Status Never Smoker  Smokeless Tobacco Never Used   Social History   Substance and Sexual Activity  Alcohol Use No   Social History   Substance and Sexual Activity  Drug Use No    Allergies:  Allergies  Allergen Reactions  . Lamisil [Terbinafine] Rash    Medications: Current Outpatient Medications  Medication Sig Dispense Refill  . acetaminophen (TYLENOL) 650 MG CR tablet Take 650 mg by mouth every 8 (eight) hours as needed for pain.    Marland Kitchen aspirin 81 MG chewable tablet Chew 81 mg by mouth daily.    Marland Kitchen atorvastatin (LIPITOR) 10 MG tablet Take 10 mg by mouth daily.    . Cholecalciferol (VITAMIN D-3) 5000 units TABS  Take 5,000 Units by mouth daily.    . Cinnamon 500 MG TABS Take 500 mg by mouth daily.     Marland Kitchen lisinopril (PRINIVIL,ZESTRIL) 20 MG tablet Take 20 mg by mouth daily.    . metoprolol succinate (TOPROL-XL) 25 MG 24 hr tablet Take 25 mg by mouth daily.    . Multiple Vitamin (MULTIVITAMIN WITH MINERALS) TABS tablet Take 1 tablet by mouth daily.    Marland Kitchen omeprazole (PRILOSEC) 40 MG capsule Take 40 mg by mouth daily.    . isosorbide mononitrate (IMDUR) 30 MG 24 hr tablet Take 30 mg by mouth daily. (Patient not taking: Reported on 07/23/2020)    . traMADol (ULTRAM) 50 MG tablet Take 1-2 tablets (50-100 mg total) by mouth every 6 (six) hours as needed for moderate pain. (Patient not taking: Reported on 07/23/2020) 15 tablet 0   No current facility-administered medications for this visit.    Review of Systems: GENERAL: negative for malaise, night sweats HEENT: No changes in hearing or vision, no nose bleeds or other nasal problems. NECK: Negative for lumps, goiter, pain and significant neck swelling RESPIRATORY: Negative for cough, wheezing CARDIOVASCULAR: Negative for chest pain, leg swelling, palpitations, orthopnea GI: SEE HPI MUSCULOSKELETAL: Negative for joint pain or swelling, back pain, and muscle pain. SKIN: Negative for lesions, rash PSYCH: Negative for sleep disturbance, mood disorder and recent psychosocial stressors. HEMATOLOGY Negative for prolonged bleeding, bruising easily, and swollen nodes. ENDOCRINE: Negative for cold or heat intolerance, polyuria, polydipsia and goiter. NEURO: negative for tremor, gait imbalance, syncope and seizures. The remainder of the review of systems is noncontributory.   Physical Exam: BP (!) 144/82 (BP Location: Left Arm, Patient Position: Sitting, Cuff Size: Large)   Pulse 90   Temp 98.3 F (36.8 C) (Oral)   Ht 5' 3.8" (1.621 m)   Wt 158 lb (71.7 kg)   BMI 27.29 kg/m  GENERAL: The patient is AO x3, in no acute distress. HEENT: Head is normocephalic and  atraumatic. EOMI are intact. Mouth is well hydrated and without lesions. NECK: Supple. No masses LUNGS: Clear to auscultation. No presence of rhonchi/wheezing/rales. Adequate chest expansion HEART: RRR, normal s1 and s2. ABDOMEN: Soft, nontender, no guarding, no peritoneal signs, and nondistended. BS +. No masses. EXTREMITIES: Without any cyanosis, clubbing, rash, lesions or edema. NEUROLOGIC: AOx3, no focal motor deficit. SKIN: no jaundice, no rashes   Imaging/Labs: as above  I personally reviewed and interpreted the available labs, imaging and endoscopic files.  Impression and Plan: Beth Garcia is a 70 y.o. female with PMH GERD, DM, HTN, HLD, hyperparathyroidism s/p gland resection, who presents for evaluation of iron deficiency anemia.  Patient has evidence of iron deficiency anemia on recent blood work-up without overt gastrointestinal bleeding.  She has received IV iron infusion as she has not tolerated oral iron adequately.  We will be important to explore any gastrointestinal losses with an EGD with small bowel biopsies and a colonoscopy.  I explained to her that this possible her chronic losses are related  to AVMs, for which if her endoscopic evaluation is negative, we will need to proceed with a capsule endoscopy.  In terms of her abdominal discomfort, this has improved with the use of omeprazole which could be related to functional dyspepsia.  She needs to continue this medication.  Patient understood and agreed.  - Schedule EGD with small bowel biopsies and colonoscopy - Continue with IV iron infusions - Continue omeprazole 40 mg qday  All questions were answered.      Maylon Peppers, MD Gastroenterology and Hepatology [facility]

## 2020-07-23 NOTE — Progress Notes (Signed)
Maylon Peppers, M.D. Gastroenterology & Hepatology [facility] [address] [place], [place] [address] Primary Care Physician: Caryl Bis, MD [place] [id number]  Referring MD: PCP  Chief Complaint: iron deficiency anemia  History of Present Illness: Beth Garcia is a 70 y.o. female with PMH GERD, DM, HTN, HLD, hyperparathyroidism s/p gland resection, who presents for evaluation of iron deficiency anemia.  Patient reports that in September 2021 she was found to have new onset having worsening iron deficiency. She reports that she was put on oral iron but she developed diarrhea. She reports she has has been infused with iron x2 in the last 2 weeks, never required a blood transfusion.  The patient denies having any nausea, vomiting, fever, chills, hematochezia, melena, hematemesis, abdominal distention, diarrhea, jaundice, pruritus, odynophagia, dysphagia or weight loss.  Patient brings records from previous evaluation on 07/06/2020 which showed hemoglobin of 9.5, MCV 77, platelets 250, vesicle 9.2, iron 47, ferritin 6, iron saturation 10%, TIBC 458.  Folic acid was normal at 16 and vitamin B12 was 349 normal.  She had negative H. pylori IgG and lipase was normal 34.  Patient states that a month ago she noticed epigastric abdominal discomfort that does not radiate anywhere else. She reports that she started taking omeprazole 40 mg every day, which has resolved her discomfort. Noticed that when she puts lemon juice in her drinks she has worsening discomfort.  Denies intake of NSAIDs, anticoagulants, high dose aspirin, or any other antiplatelet.  Patient is a retired [employer] scientist.  Patient reports she had a normal stress test last Friday for evaluation of episodes of chest pain and neck pain. She was told that she was OK to undergo an endoscopic investigation.  Last EGD: never Last Colonoscopy:>10 years ago, had a few polyps removed  per the patient  FHx: neg for any gastrointestinal/liver disease, no malignancies Social: neg smoking, alcohol or illicit drug use Surgical: hysterectomy  Past Medical History: Past Medical History:  Diagnosis Date  . Arthritis    Hands  . Diabetes mellitus, type II ([place])    diet and exercise controlled  . Elevated temperature    Notmal temp is 99-100  . GERD (gastroesophageal reflux disease)   . History of kidney stones   . Hypercalcemia   . Hyperlipidemia   . Hypertension   . Hyperthyroidism    Mild, remote history  . Nephrolithiasis   . Vitamin D deficiency     Past Surgical History: Past Surgical History:  Procedure Laterality Date  . ABDOMINAL HYSTERECTOMY    . bilateral breast biopsy    . COLONOSCOPY W/ POLYPECTOMY    . PARATHYROIDECTOMY Right 02/01/2018   Procedure: RIGHT PARATHYROIDECTOMY;  Surgeon: Armandina Gemma, MD;  Location: [facility];  Service: General;  Laterality: Right;  . TONSILLECTOMY      Family History: Family History  Problem Relation Age of Onset  . Hypertension Mother   . Hyperlipidemia Mother   . Hyperlipidemia Father   . Stroke Father   . Cancer Father   . Hypertension Sister   . Hyperlipidemia Sister   . Hypertension Brother   . Hyperlipidemia Brother     Social History: Social History   Tobacco Use  Smoking Status Never Smoker  Smokeless Tobacco Never Used   Social History   Substance and Sexual Activity  Alcohol Use No   Social History   Substance and Sexual Activity  Drug Use No    Allergies:  Allergies  Allergen Reactions  . Lamisil [Terbinafine] Rash    Medications: Current Outpatient Medications  Medication Sig Dispense Refill  . acetaminophen (TYLENOL) 650 MG CR tablet Take 650 mg by mouth every 8 (eight) hours as needed for pain.    Marland Kitchen aspirin 81 MG chewable tablet Chew 81 mg by mouth daily.    Marland Kitchen atorvastatin (LIPITOR) 10 MG tablet Take 10 mg by mouth daily.    . Cholecalciferol (VITAMIN D-3) 5000 units TABS  Take 5,000 Units by mouth daily.    . Cinnamon 500 MG TABS Take 500 mg by mouth daily.     Marland Kitchen lisinopril (PRINIVIL,ZESTRIL) 20 MG tablet Take 20 mg by mouth daily.    . metoprolol succinate (TOPROL-XL) 25 MG 24 hr tablet Take 25 mg by mouth daily.    . Multiple Vitamin (MULTIVITAMIN WITH MINERALS) TABS tablet Take 1 tablet by mouth daily.    Marland Kitchen omeprazole (PRILOSEC) 40 MG capsule Take 40 mg by mouth daily.    . isosorbide mononitrate (IMDUR) 30 MG 24 hr tablet Take 30 mg by mouth daily. (Patient not taking: Reported on 07/23/2020)    . traMADol (ULTRAM) 50 MG tablet Take 1-2 tablets (50-100 mg total) by mouth every 6 (six) hours as needed for moderate pain. (Patient not taking: Reported on 07/23/2020) 15 tablet 0   No current facility-administered medications for this visit.    Review of Systems: GENERAL: negative for malaise, night sweats HEENT: No changes in hearing or vision, no nose bleeds or other nasal problems. NECK: Negative for lumps, goiter, pain and significant neck swelling RESPIRATORY: Negative for cough, wheezing CARDIOVASCULAR: Negative for chest pain, leg swelling, palpitations, orthopnea GI: SEE HPI MUSCULOSKELETAL: Negative for joint pain or swelling, back pain, and muscle pain. SKIN: Negative for lesions, rash PSYCH: Negative for sleep disturbance, mood disorder and recent psychosocial stressors. HEMATOLOGY Negative for prolonged bleeding, bruising easily, and swollen nodes. ENDOCRINE: Negative for cold or heat intolerance, polyuria, polydipsia and goiter. NEURO: negative for tremor, gait imbalance, syncope and seizures. The remainder of the review of systems is noncontributory.   Physical Exam: BP (!) 144/82 (BP Location: Left Arm, Patient Position: Sitting, Cuff Size: Large)   Pulse 90   Temp 98.3 F (36.8 C) (Oral)   Ht 5' 3.8" (1.621 m)   Wt 158 lb (71.7 kg)   BMI 27.29 kg/m  GENERAL: The patient is AO x3, in no acute distress. HEENT: Head is normocephalic and  atraumatic. EOMI are intact. Mouth is well hydrated and without lesions. NECK: Supple. No masses LUNGS: Clear to auscultation. No presence of rhonchi/wheezing/rales. Adequate chest expansion HEART: RRR, normal s1 and s2. ABDOMEN: Soft, nontender, no guarding, no peritoneal signs, and nondistended. BS +. No masses. EXTREMITIES: Without any cyanosis, clubbing, rash, lesions or edema. NEUROLOGIC: AOx3, no focal motor deficit. SKIN: no jaundice, no rashes   Imaging/Labs: as above  I personally reviewed and interpreted the available labs, imaging and endoscopic files.  Impression and Plan: Beth Garcia is a 70 y.o. female with PMH GERD, DM, HTN, HLD, hyperparathyroidism s/p gland resection, who presents for evaluation of iron deficiency anemia.  Patient has evidence of iron deficiency anemia on recent blood work-up without overt gastrointestinal bleeding.  She has received IV iron infusion as she has not tolerated oral iron adequately.  We will be important to explore any gastrointestinal losses with an EGD with small bowel biopsies and a colonoscopy.  I explained to her that this possible her chronic losses are related  to AVMs, for which if her endoscopic evaluation is negative, we will need to proceed with a capsule endoscopy.  In terms of her abdominal discomfort, this has improved with the use of omeprazole which could be related to functional dyspepsia.  She needs to continue this medication.  Patient understood and agreed.  - Schedule EGD with small bowel biopsies and colonoscopy - Continue with IV iron infusions - Continue omeprazole 40 mg qday  All questions were answered.      Maylon Peppers, MD Gastroenterology and Hepatology [facility]

## 2020-07-23 NOTE — Patient Instructions (Signed)
Schedule EGD and colonoscopy Continue with IV iron infusions Continue omeprazole 40 mg qday

## 2020-08-04 ENCOUNTER — Encounter (INDEPENDENT_AMBULATORY_CARE_PROVIDER_SITE_OTHER): Payer: Self-pay

## 2020-08-13 DIAGNOSIS — M5412 Radiculopathy, cervical region: Secondary | ICD-10-CM | POA: Diagnosis not present

## 2020-08-17 ENCOUNTER — Other Ambulatory Visit (HOSPITAL_COMMUNITY)
Admission: RE | Admit: 2020-08-17 | Discharge: 2020-08-17 | Disposition: A | Discharge status: Home / Self Care | Payer: Medicare Other | Source: Ambulatory Visit | Attending: Gastroenterology | Admitting: Gastroenterology

## 2020-08-17 ENCOUNTER — Other Ambulatory Visit: Payer: Self-pay

## 2020-08-17 DIAGNOSIS — Z20822 Contact with and (suspected) exposure to covid-19: Secondary | ICD-10-CM | POA: Insufficient documentation

## 2020-08-17 DIAGNOSIS — Z01812 Encounter for preprocedural laboratory examination: Secondary | ICD-10-CM | POA: Diagnosis not present

## 2020-08-17 LAB — SARS CORONAVIRUS 2 (TAT 6-24 HRS): SARS Coronavirus 2: NEGATIVE

## 2020-08-19 ENCOUNTER — Ambulatory Visit (HOSPITAL_COMMUNITY): Payer: Medicare Other | Admitting: Anesthesiology

## 2020-08-19 ENCOUNTER — Encounter (HOSPITAL_COMMUNITY): Payer: Self-pay | Admitting: Gastroenterology

## 2020-08-19 ENCOUNTER — Other Ambulatory Visit: Payer: Self-pay

## 2020-08-19 ENCOUNTER — Ambulatory Visit (HOSPITAL_COMMUNITY)
Admission: RE | Admit: 2020-08-19 | Discharge: 2020-08-19 | Disposition: A | Discharge status: Home / Self Care | Payer: Medicare Other | Attending: Gastroenterology | Admitting: Gastroenterology

## 2020-08-19 ENCOUNTER — Encounter (HOSPITAL_COMMUNITY)
Admission: RE | Disposition: A | Discharge status: Home / Self Care | Payer: Self-pay | Source: Home / Self Care | Attending: Gastroenterology

## 2020-08-19 DIAGNOSIS — E119 Type 2 diabetes mellitus without complications: Secondary | ICD-10-CM | POA: Insufficient documentation

## 2020-08-19 DIAGNOSIS — K259 Gastric ulcer, unspecified as acute or chronic, without hemorrhage or perforation: Secondary | ICD-10-CM | POA: Insufficient documentation

## 2020-08-19 DIAGNOSIS — Z7982 Long term (current) use of aspirin: Secondary | ICD-10-CM | POA: Insufficient documentation

## 2020-08-19 DIAGNOSIS — D509 Iron deficiency anemia, unspecified: Secondary | ICD-10-CM | POA: Diagnosis not present

## 2020-08-19 DIAGNOSIS — E890 Postprocedural hypothyroidism: Secondary | ICD-10-CM | POA: Insufficient documentation

## 2020-08-19 DIAGNOSIS — I10 Essential (primary) hypertension: Secondary | ICD-10-CM | POA: Insufficient documentation

## 2020-08-19 DIAGNOSIS — K449 Diaphragmatic hernia without obstruction or gangrene: Secondary | ICD-10-CM | POA: Diagnosis not present

## 2020-08-19 DIAGNOSIS — Z883 Allergy status to other anti-infective agents status: Secondary | ICD-10-CM | POA: Insufficient documentation

## 2020-08-19 DIAGNOSIS — E785 Hyperlipidemia, unspecified: Secondary | ICD-10-CM | POA: Diagnosis not present

## 2020-08-19 DIAGNOSIS — Z8249 Family history of ischemic heart disease and other diseases of the circulatory system: Secondary | ICD-10-CM | POA: Insufficient documentation

## 2020-08-19 DIAGNOSIS — K5730 Diverticulosis of large intestine without perforation or abscess without bleeding: Secondary | ICD-10-CM

## 2020-08-19 DIAGNOSIS — E0590 Thyrotoxicosis, unspecified without thyrotoxic crisis or storm: Secondary | ICD-10-CM | POA: Diagnosis not present

## 2020-08-19 DIAGNOSIS — Z90710 Acquired absence of both cervix and uterus: Secondary | ICD-10-CM | POA: Diagnosis not present

## 2020-08-19 DIAGNOSIS — Z79899 Other long term (current) drug therapy: Secondary | ICD-10-CM | POA: Diagnosis not present

## 2020-08-19 HISTORY — PX: BIOPSY: SHX5522

## 2020-08-19 HISTORY — PX: ESOPHAGOGASTRODUODENOSCOPY (EGD) WITH PROPOFOL: SHX5813

## 2020-08-19 HISTORY — PX: COLONOSCOPY WITH PROPOFOL: SHX5780

## 2020-08-19 LAB — HM COLONOSCOPY

## 2020-08-19 LAB — GLUCOSE, CAPILLARY: Glucose-Capillary: 97 mg/dL (ref 70–99)

## 2020-08-19 SURGERY — COLONOSCOPY WITH PROPOFOL
Anesthesia: General

## 2020-08-19 MED ORDER — OMEPRAZOLE 40 MG PO CPDR: 40.0000 mg | DELAYED_RELEASE_CAPSULE | Freq: Every day | ORAL | 3 refills | Status: DC

## 2020-08-19 MED ORDER — GLYCOPYRROLATE 0.2 MG/ML IJ SOLN: 0.2000 mg | Freq: Once | INTRAMUSCULAR | Status: AC

## 2020-08-19 MED ORDER — LIDOCAINE VISCOUS HCL 2 % MT SOLN: 15.0000 mL | Freq: Once | OROMUCOSAL | Status: AC

## 2020-08-19 MED ORDER — PROPOFOL 500 MG/50ML IV EMUL
INTRAVENOUS | Status: DC | PRN
  Administered 2020-08-19: 150 ug/kg/min via INTRAVENOUS

## 2020-08-19 MED ORDER — PROPOFOL 10 MG/ML IV BOLUS
INTRAVENOUS | Status: AC
  Filled 2020-08-19: qty 120

## 2020-08-19 MED ORDER — LIDOCAINE VISCOUS HCL 2 % MT SOLN
OROMUCOSAL | Status: AC
  Administered 2020-08-19: 15 mL via OROMUCOSAL
  Filled 2020-08-19: qty 15

## 2020-08-19 MED ORDER — GLYCOPYRROLATE 0.2 MG/ML IJ SOLN
INTRAMUSCULAR | Status: AC
  Administered 2020-08-19: 0.2 mg via INTRAVENOUS
  Filled 2020-08-19: qty 1

## 2020-08-19 MED ORDER — LACTATED RINGERS IV SOLN: INTRAVENOUS | Status: DC

## 2020-08-19 MED ORDER — PROPOFOL 10 MG/ML IV BOLUS
INTRAVENOUS | Status: DC | PRN
  Administered 2020-08-19: 50 mg via INTRAVENOUS

## 2020-08-19 NOTE — Interval H&P Note (Signed)
History and Physical Interval Note:  08/19/2020 7:33 AM  Beth Garcia is a 70 y.o. female with PMH GERD, DM, HTN, HLD, hyperparathyroidism s/p gland resection, who comes to the hospital for evaluation of iron deficiency anemia.  Patient states feeling well.  Denies having any melena, hematochezia, nausea, vomiting, fever, chills, any change in her weight.  States feeling fine.  Had a colonoscopy more than 10 years ago but never had an EGD in the past.  Denies any signs of overt gastrointestinal bleeding.  BP (!) 168/86   Temp 98.4 F (36.9 C) (Oral)   Resp 18   Ht 5\' 4"  (1.626 m)   Wt 68 kg   SpO2 98%   BMI 25.75 kg/m  GENERAL: The patient is AO x3, in no acute distress. HEENT: Head is normocephalic and atraumatic. EOMI are intact. Mouth is well hydrated and without lesions. NECK: Supple. No masses LUNGS: Clear to auscultation. No presence of rhonchi/wheezing/rales. Adequate chest expansion HEART: RRR, normal s1 and s2. ABDOMEN: Soft, nontender, no guarding, no peritoneal signs, and nondistended. BS +. No masses. EXTREMITIES: Without any cyanosis, clubbing, rash, lesions or edema. NEUROLOGIC: AOx3, no focal motor deficit. SKIN: no jaundice, no rashes   Beth Garcia  has presented today for surgery, with the diagnosis of Iron Deficiency Anemia.  The various methods of treatment have been discussed with the patient and family. After consideration of risks, benefits and other options for treatment, the patient has consented to  Procedure(s) with comments: COLONOSCOPY WITH PROPOFOL (N/A) - 7:30 ESOPHAGOGASTRODUODENOSCOPY (EGD) WITH PROPOFOL (N/A) as a surgical intervention.  The patient's history has been reviewed, patient examined, no change in status, stable for surgery.  I have reviewed the patient's chart and labs.  Questions were answered to the patient's satisfaction.     Maylon Peppers [place]

## 2020-08-19 NOTE — Transfer of Care (Signed)
Immediate Anesthesia Transfer of Care Note  Patient: [name]  Procedure(s) Performed: COLONOSCOPY WITH PROPOFOL (N/A ) ESOPHAGOGASTRODUODENOSCOPY (EGD) WITH PROPOFOL (N/A ) BIOPSY  Patient Location: PACU  Anesthesia Type:General  Level of Consciousness: awake, alert , oriented and patient cooperative  Airway & Oxygen Therapy: Patient Spontanous Breathing  Post-op Assessment: Report given to RN, Post -op Vital signs reviewed and stable and Patient moving all extremities X 4  Post vital signs: Reviewed and stable  Last Vitals:  Vitals Value Taken Time  BP 130/83 08/19/20 0821  Temp 36.9 C 08/19/20 0821  Pulse    Resp 15 08/19/20 0821  SpO2 99 % 08/19/20 0821    Last Pain:  Vitals:   08/19/20 0821  TempSrc: Oral  PainSc: 0-No pain      Patients Stated Pain Goal: 10 (36/68/15 9470)  Complications: No complications documented.

## 2020-08-19 NOTE — Discharge Instructions (Signed)
You are being discharged to home.  Resume your previous diet.  We are waiting for your pathology results.  Your physician has recommended a repeat colonoscopy in 10 years for screening purposes.  Start omeprazole 40 mg every day, will follow up in clinic. If still low hemoglobin will need to perform capsule endoscopy.   Upper Endoscopy, Adult, Care After This sheet gives you information about how to care for yourself after your procedure. Your health care provider may also give you more specific instructions. If you have problems or questions, contact your health care provider. What can I expect after the procedure? After the procedure, it is common to have:  A sore throat.  Mild stomach pain or discomfort.  Bloating.  Nausea. Follow these instructions at home:  Follow instructions from your health care provider about what to eat or drink after your procedure.  Return to your normal activities as told by your health care provider. Ask your health care provider what activities are safe for you.  Take over-the-counter and prescription medicines only as told by your health care provider.  If you were given a sedative during the procedure, it can affect you for several hours. Do not drive or operate machinery until your health care provider says that it is safe.  Keep all follow-up visits as told by your health care provider. This is important.   Contact a health care provider if you have:  A sore throat that lasts longer than one day.  Trouble swallowing. Get help right away if:  You vomit blood or your vomit looks like coffee grounds.  You have: ? A fever. ? Bloody, black, or tarry stools. ? A severe sore throat or you cannot swallow. ? Difficulty breathing. ? Severe pain in your chest or abdomen. Summary  After the procedure, it is common to have a sore throat, mild stomach discomfort, bloating, and nausea.  If you were given a sedative during the procedure, it can  affect you for several hours. Do not drive or operate machinery until your health care provider says that it is safe.  Follow instructions from your health care provider about what to eat or drink after your procedure.  Return to your normal activities as told by your health care provider. This information is not intended to replace advice given to you by your health care provider. Make sure you discuss any questions you have with your health care provider. Document Revised: 06/18/2019 Document Reviewed: 11/20/2017 Elsevier Patient Education  2021 [place].  Colonoscopy, Adult, Care After This sheet gives you information about how to care for yourself after your procedure. Your doctor may also give you more specific instructions. If you have problems or questions, call your doctor. What can I expect after the procedure? After the procedure, it is common to have:  A small amount of blood in your poop (stool) for 24 hours.  Some gas.  Mild cramping or bloating in your belly (abdomen). Follow these instructions at home: Eating and drinking  Drink enough fluid to keep your pee (urine) pale yellow.  Follow instructions from your doctor about what you cannot eat or drink.  Return to your normal diet as told by your doctor. Avoid heavy or fried foods that are hard to digest.   Activity  Rest as told by your doctor.  Do not sit for a long time without moving. Get up to take short walks every 1-2 hours. This is important. Ask for help if you feel weak  or unsteady.  Return to your normal activities as told by your doctor. Ask your doctor what activities are safe for you. To help cramping and bloating:  Try walking around.  Put heat on your belly as told by your doctor. Use the heat source that your doctor recommends, such as a moist heat pack or a heating pad. ? Put a towel between your skin and the heat source. ? Leave the heat on for 20-30 minutes. ? Remove the heat if your skin  turns bright red. This is very important if you are unable to feel pain, heat, or cold. You may have a greater risk of getting burned.   General instructions  If you were given a medicine to help you relax (sedative) during your procedure, it can affect you for many hours. Do not drive or use machinery until your doctor says that it is safe.  For the first 24 hours after the procedure: ? Do not sign important documents. ? Do not drink alcohol. ? Do your daily activities more slowly than normal. ? Eat foods that are soft and easy to digest.  Take over-the-counter or prescription medicines only as told by your doctor.  Keep all follow-up visits as told by your doctor. This is important. Contact a doctor if:  You have blood in your poop 2-3 days after the procedure. Get help right away if:  You have more than a small amount of blood in your poop.  You see large clumps of tissue (blood clots) in your poop.  Your belly is swollen.  You feel like you may vomit (nauseous).  You vomit.  You have a fever.  You have belly pain that gets worse, and medicine does not help your pain. Summary  After the procedure, it is common to have a small amount of blood in your poop. You may also have mild cramping and bloating in your belly.  If you were given a medicine to help you relax (sedative) during your procedure, it can affect you for many hours. Do not drive or use machinery until your doctor says that it is safe.  Get help right away if you have a lot of blood in your poop, feel like you may vomit, have a fever, or have more belly pain. This information is not intended to replace advice given to you by your health care provider. Make sure you discuss any questions you have with your health care provider. Document Revised: 04/26/2019 Document Reviewed: 01/14/2019 Elsevier Patient Education  2021 [place].  Diverticulosis  Diverticulosis is a condition that develops when small  pouches (diverticula) form in the wall of the large intestine (colon). The colon is where water is absorbed and stool (feces) is formed. The pouches form when the inside layer of the colon pushes through weak spots in the outer layers of the colon. You may have a few pouches or many of them. The pouches usually do not cause problems unless they become inflamed or infected. When this happens, the condition is called diverticulitis. What are the causes? The cause of this condition is not known. What increases the risk? The following factors may make you more likely to develop this condition:  Being older than age 6. Your risk for this condition increases with age. Diverticulosis is rare among people younger than age 88. By age 28, many people have it.  Eating a low-fiber diet.  Having frequent constipation.  Being overweight.  Not getting enough exercise.  Smoking.  Taking  over-the-counter pain medicines, like aspirin and ibuprofen.  Having a family history of diverticulosis. What are the signs or symptoms? In most people, there are no symptoms of this condition. If you do have symptoms, they may include:  Bloating.  Cramps in the abdomen.  Constipation or diarrhea.  Pain in the lower left side of the abdomen. How is this diagnosed? Because diverticulosis usually has no symptoms, it is most often diagnosed during an exam for other colon problems. The condition may be diagnosed by:  Using a flexible scope to examine the colon (colonoscopy).  Taking an X-ray of the colon after dye has been put into the colon (barium enema).  Having a CT scan. How is this treated? You may not need treatment for this condition. Your health care provider may recommend treatment to prevent problems. You may need treatment if you have symptoms or if you previously had diverticulitis. Treatment may include:  Eating a high-fiber diet.  Taking a fiber supplement.  Taking a live bacteria supplement  (probiotic).  Taking medicine to relax your colon.   Follow these instructions at home: Medicines  Take over-the-counter and prescription medicines only as told by your health care provider.  If told by your health care provider, take a fiber supplement or probiotic. Constipation prevention Your condition may cause constipation. To prevent or treat constipation, you may need to:  Drink enough fluid to keep your urine pale yellow.  Take over-the-counter or prescription medicines.  Eat foods that are high in fiber, such as beans, whole grains, and fresh fruits and vegetables.  Limit foods that are high in fat and processed sugars, such as fried or sweet foods.   General instructions  Try not to strain when you have a bowel movement.  Keep all follow-up visits as told by your health care provider. This is important. Contact a health care provider if you:  Have pain in your abdomen.  Have bloating.  Have cramps.  Have not had a bowel movement in 3 days. Get help right away if:  Your pain gets worse.  Your bloating becomes very bad.  You have a fever or chills, and your symptoms suddenly get worse.  You vomit.  You have bowel movements that are bloody or black.  You have bleeding from your rectum. Summary  Diverticulosis is a condition that develops when small pouches (diverticula) form in the wall of the large intestine (colon).  You may have a few pouches or many of them.  This condition is most often diagnosed during an exam for other colon problems.  Treatment may include increasing the fiber in your diet, taking supplements, or taking medicines. This information is not intended to replace advice given to you by your health care provider. Make sure you discuss any questions you have with your health care provider. Document Revised: 01/17/2019 Document Reviewed: 01/17/2019 Elsevier Patient Education  [place].

## 2020-08-19 NOTE — Op Note (Signed)
[facility] Patient Name: Beth Garcia Procedure Date: 08/19/2020 7:54 AM MRN: [id number] Date of Birth: Dec 19, 1950 Attending MD: Maylon Peppers ,  CSN: [id number] Age: 70 Admit Type: Outpatient Procedure:                Colonoscopy Indications:              Iron deficiency anemia Providers:                Maylon Peppers, Gwenlyn Fudge, RN, Aram Candela Referring MD:              Medicines:                Monitored Anesthesia Care Complications:            No immediate complications. Estimated Blood Loss:     Estimated blood loss: none. Procedure:                Pre-Anesthesia Assessment:                           - Prior to the procedure, a History and Physical                            was performed, and patient medications, allergies                            and sensitivities were reviewed. The patient's                            tolerance of previous anesthesia was reviewed.                           - The risks and benefits of the procedure and the                            sedation options and risks were discussed with the                            patient. All questions were answered and informed                            consent was obtained.                           - ASA Grade Assessment: II - A patient with mild                            systemic disease.                           After obtaining informed consent, the colonoscope                            was passed under direct vision. Throughout the                            procedure, the patient's blood pressure, pulse, and  oxygen saturations were monitored continuously. The                            [id number]-HQ190L ([id number]) scope was introduced through                            the anus and advanced to the 10 cm into the ileum.                            The colonoscopy was performed without difficulty.                            The patient tolerated the procedure well. The                             quality of the bowel preparation was excellent.                            Scope withdrawal time was 15 minutes. Scope In: 5:80:99 AM Scope Out: 8:17:05 AM Scope Withdrawal Time: 0 hours 17 minutes 26 seconds  Total Procedure Duration: 0 hours 20 minutes 24 seconds  Findings:      The perianal and digital rectal examinations were normal.      The terminal ileum appeared normal.      A single large-mouthed diverticulum was found in the sigmoid colon.      The retroflexed view of the distal rectum and anal verge was normal and       showed no anal or rectal abnormalities. Impression:               - The examined portion of the ileum was normal.                           - Diverticulosis in the sigmoid colon.                           - The distal rectum and anal verge are normal on                            retroflexion view.                           - No specimens collected. Moderate Sedation:      Per Anesthesia Care Recommendation:           - Discharge patient to home (ambulatory).                           - Resume previous diet.                           - Repeat colonoscopy in 10 years for screening                            purposes. Procedure Code(s):        --- Professional ---  45378, Colonoscopy, flexible; diagnostic, including                            collection of specimen(s) by brushing or washing,                            when performed (separate procedure) Diagnosis Code(s):        --- Professional ---                           D50.9, Iron deficiency anemia, unspecified                           K57.30, Diverticulosis of large intestine without                            perforation or abscess without bleeding CPT copyright 2019 American Medical Association. All rights reserved. The codes documented in this report are preliminary and upon coder review may  be revised to meet current compliance  requirements. Maylon Peppers, MD Maylon Peppers,  08/19/2020 8:29:04 AM This report has been signed electronically. Number of Addenda: 0

## 2020-08-19 NOTE — Anesthesia Postprocedure Evaluation (Signed)
Anesthesia Post Note  Patient: Beth Garcia  Procedure(s) Performed: COLONOSCOPY WITH PROPOFOL (N/A ) ESOPHAGOGASTRODUODENOSCOPY (EGD) WITH PROPOFOL (N/A ) BIOPSY  Patient location during evaluation: Phase II Anesthesia Type: General Level of consciousness: awake, oriented, awake and alert and patient cooperative Pain management: satisfactory to patient Vital Signs Assessment: post-procedure vital signs reviewed and stable Respiratory status: spontaneous breathing, respiratory function stable and nonlabored ventilation Cardiovascular status: stable Postop Assessment: no apparent nausea or vomiting Anesthetic complications: no   No complications documented.   Last Vitals:  Vitals:   08/19/20 0652 08/19/20 0821  BP: (!) 168/86 130/83  Resp: 18 15  Temp: 36.9 C 36.9 C  SpO2: 98% 99%    Last Pain:  Vitals:   08/19/20 0821  TempSrc: Oral  PainSc: 0-No pain                 Mackenze Grandison

## 2020-08-19 NOTE — Anesthesia Preprocedure Evaluation (Addendum)
Anesthesia Evaluation  Patient identified by MRN, date of birth, ID band Patient awake    Reviewed: Allergy & Precautions, NPO status , Patient's Chart, lab work & pertinent test results, reviewed documented beta blocker date and time   History of Anesthesia Complications Negative for: history of anesthetic complications  Airway Mallampati: II  TM Distance: >3 FB Neck ROM: Full    Dental  (+) Dental Advisory Given, Teeth Intact   Pulmonary neg pulmonary ROS,    Pulmonary exam normal breath sounds clear to auscultation       Cardiovascular Exercise Tolerance: Good hypertension, Pt. on medications and Pt. on home beta blockers Normal cardiovascular exam Rhythm:Regular Rate:Normal     Neuro/Psych negative neurological ROS  negative psych ROS   GI/Hepatic GERD  Controlled,  Endo/Other  diabetes (diet controlled ), Well Controlled, Type 2Hyperthyroidism   Renal/GU Renal disease     Musculoskeletal  (+) Arthritis ,   Abdominal   Peds  Hematology  (+) anemia ,   Anesthesia Other Findings   Reproductive/Obstetrics                            Anesthesia Physical Anesthesia Plan  ASA: II  Anesthesia Plan: General   Post-op Pain Management:    Induction: Intravenous  PONV Risk Score and Plan: TIVA  Airway Management Planned: Nasal Cannula and Natural Airway  Additional Equipment:   Intra-op Plan:   Post-operative Plan:   Informed Consent: I have reviewed the patients History and Physical, chart, labs and discussed the procedure including the risks, benefits and alternatives for the proposed anesthesia with the patient or authorized representative who has indicated his/her understanding and acceptance.     Dental advisory given  Plan Discussed with: CRNA and Surgeon  Anesthesia Plan Comments:         Anesthesia Quick Evaluation

## 2020-08-19 NOTE — Op Note (Addendum)
[facility] Patient Name: Beth Garcia Procedure Date: 08/19/2020 7:07 AM MRN: [id number] Date of Birth: 06-26-1951 Attending MD: Maylon Peppers ,  CSN: [id number] Age: 70 Admit Type: Outpatient Procedure:                Upper GI endoscopy Indications:              Iron deficiency anemia Providers:                Maylon Peppers, Gwenlyn Fudge, RN, Aram Candela Referring MD:              Medicines:                Monitored Anesthesia Care Complications:            No immediate complications. Estimated Blood Loss:     Estimated blood loss: none. Procedure:                Pre-Anesthesia Assessment:                           - Prior to the procedure, a History and Physical                            was performed, and patient medications, allergies                            and sensitivities were reviewed. The patient's                            tolerance of previous anesthesia was reviewed.                           - The risks and benefits of the procedure and the                            sedation options and risks were discussed with the                            patient. All questions were answered and informed                            consent was obtained.                           - ASA Grade Assessment: II - A patient with mild                            systemic disease.                           After obtaining informed consent, the endoscope was                            passed under direct vision. Throughout the                            procedure, the patient's blood pressure,  pulse, and                            oxygen saturations were monitored continuously. The                            [id number]) was introduced through the mouth,                            and advanced to the second part of duodenum. The                            upper GI endoscopy was accomplished without                            difficulty. The patient tolerated the  procedure                            well. Scope In: 7:45:16 AM Scope Out: 7:52:11 AM Total Procedure Duration: 0 hours 6 minutes 55 seconds  Findings:      An 8 cm hiatal hernia with a few Cameron erosions was found. The       proximal extent of the gastric folds (end of tubular esophagus) was 40       cm from the incisors. The Z-line was 32 cm from the incisors.      The entire examined stomach was normal.      The examined duodenum was normal. Biopsies for histology were taken with       a cold forceps for evaluation of celiac disease. Impression:               - 8 cm hiatal hernia with a few Cameron erosions.                           - Normal stomach.                           - Normal examined duodenum. Biopsied. Moderate Sedation:      Per Anesthesia Care Recommendation:           - Discharge patient to home (ambulatory).                           - Resume previous diet.                           - Await pathology results.                           - Start omeprazole 40 mg every day, will follow up                            in clinic. If still low hemoglobin will need to                            perform capsule endoscopy. Procedure Code(s):        --- Professional ---  45625, Esophagogastroduodenoscopy, flexible,                            transoral; with biopsy, single or multiple Diagnosis Code(s):        --- Professional ---                           K44.9, Diaphragmatic hernia without obstruction or                            gangrene                           K25.9, Gastric ulcer, unspecified as acute or                            chronic, without hemorrhage or perforation                           D50.9, Iron deficiency anemia, unspecified CPT copyright 2019 American Medical Association. All rights reserved. The codes documented in this report are preliminary and upon coder review may  be revised to meet current compliance  requirements. Maylon Peppers, MD Maylon Peppers,  08/19/2020 8:26:18 AM This report has been signed electronically. Number of Addenda: 0

## 2020-08-20 LAB — SURGICAL PATHOLOGY

## 2020-08-21 ENCOUNTER — Encounter (INDEPENDENT_AMBULATORY_CARE_PROVIDER_SITE_OTHER): Payer: Self-pay | Admitting: *Deleted

## 2020-08-24 ENCOUNTER — Encounter (HOSPITAL_COMMUNITY): Payer: Self-pay | Admitting: Gastroenterology

## 2020-08-25 ENCOUNTER — Encounter (HOSPITAL_COMMUNITY): Payer: Self-pay | Admitting: Gastroenterology

## 2020-08-27 DIAGNOSIS — M5412 Radiculopathy, cervical region: Secondary | ICD-10-CM | POA: Diagnosis not present

## 2020-09-07 DIAGNOSIS — I10 Essential (primary) hypertension: Secondary | ICD-10-CM | POA: Diagnosis not present

## 2020-09-07 DIAGNOSIS — I208 Other forms of angina pectoris: Secondary | ICD-10-CM | POA: Diagnosis not present

## 2020-09-07 DIAGNOSIS — E785 Hyperlipidemia, unspecified: Secondary | ICD-10-CM | POA: Diagnosis not present

## 2020-09-18 DIAGNOSIS — I208 Other forms of angina pectoris: Secondary | ICD-10-CM | POA: Diagnosis not present

## 2020-09-21 DIAGNOSIS — R931 Abnormal findings on diagnostic imaging of heart and coronary circulation: Secondary | ICD-10-CM | POA: Diagnosis not present

## 2020-09-21 DIAGNOSIS — I2510 Atherosclerotic heart disease of native coronary artery without angina pectoris: Secondary | ICD-10-CM | POA: Diagnosis not present

## 2020-09-25 ENCOUNTER — Telehealth (INDEPENDENT_AMBULATORY_CARE_PROVIDER_SITE_OTHER): Payer: Self-pay | Admitting: Gastroenterology

## 2020-09-25 NOTE — Telephone Encounter (Signed)
I called the patient to discuss the results of her most recent CTA which she has already discussed with her cardiologist.  She states that she is planned to have a cardiac cath which I consider appropriate for her to have.  I told her that if she was presenting persistent anemia despite taking the PPI every day, she should consider having surgery for her hiatal hernia as she has [name] lesions that could be having slow bleeding intermittently.  She understood and will discuss this with her PCP once she has her repeat blood work-up.  The patient understood and agreed.  Maylon Peppers, MD Gastroenterology and Hepatology [facility]

## 2020-10-01 ENCOUNTER — Encounter (INDEPENDENT_AMBULATORY_CARE_PROVIDER_SITE_OTHER): Payer: Self-pay

## 2020-10-01 DIAGNOSIS — E1165 Type 2 diabetes mellitus with hyperglycemia: Secondary | ICD-10-CM | POA: Diagnosis not present

## 2020-10-01 DIAGNOSIS — E782 Mixed hyperlipidemia: Secondary | ICD-10-CM | POA: Diagnosis not present

## 2020-10-01 DIAGNOSIS — D519 Vitamin B12 deficiency anemia, unspecified: Secondary | ICD-10-CM | POA: Diagnosis not present

## 2020-10-01 DIAGNOSIS — I10 Essential (primary) hypertension: Secondary | ICD-10-CM | POA: Diagnosis not present

## 2020-10-01 DIAGNOSIS — D509 Iron deficiency anemia, unspecified: Secondary | ICD-10-CM | POA: Diagnosis not present

## 2020-10-01 DIAGNOSIS — K219 Gastro-esophageal reflux disease without esophagitis: Secondary | ICD-10-CM | POA: Diagnosis not present

## 2020-10-01 DIAGNOSIS — D520 Dietary folate deficiency anemia: Secondary | ICD-10-CM | POA: Diagnosis not present

## 2020-10-01 DIAGNOSIS — E7849 Other hyperlipidemia: Secondary | ICD-10-CM | POA: Diagnosis not present

## 2020-10-06 DIAGNOSIS — E1169 Type 2 diabetes mellitus with other specified complication: Secondary | ICD-10-CM | POA: Diagnosis not present

## 2020-10-06 DIAGNOSIS — Z1331 Encounter for screening for depression: Secondary | ICD-10-CM | POA: Diagnosis not present

## 2020-10-06 DIAGNOSIS — Z23 Encounter for immunization: Secondary | ICD-10-CM | POA: Diagnosis not present

## 2020-10-06 DIAGNOSIS — E7849 Other hyperlipidemia: Secondary | ICD-10-CM | POA: Diagnosis not present

## 2020-10-06 DIAGNOSIS — I10 Essential (primary) hypertension: Secondary | ICD-10-CM | POA: Diagnosis not present

## 2020-10-06 DIAGNOSIS — R002 Palpitations: Secondary | ICD-10-CM | POA: Diagnosis not present

## 2020-10-06 DIAGNOSIS — Z1389 Encounter for screening for other disorder: Secondary | ICD-10-CM | POA: Diagnosis not present

## 2020-10-06 DIAGNOSIS — D509 Iron deficiency anemia, unspecified: Secondary | ICD-10-CM | POA: Diagnosis not present

## 2020-10-19 ENCOUNTER — Ambulatory Visit (INDEPENDENT_AMBULATORY_CARE_PROVIDER_SITE_OTHER): Payer: Medicare Other | Admitting: Gastroenterology

## 2020-11-02 DIAGNOSIS — J0101 Acute recurrent maxillary sinusitis: Secondary | ICD-10-CM | POA: Diagnosis not present

## 2020-11-02 DIAGNOSIS — Z20828 Contact with and (suspected) exposure to other viral communicable diseases: Secondary | ICD-10-CM | POA: Diagnosis not present

## 2020-11-16 ENCOUNTER — Ambulatory Visit (INDEPENDENT_AMBULATORY_CARE_PROVIDER_SITE_OTHER): Payer: Medicare Other | Admitting: Gastroenterology

## 2020-11-19 DIAGNOSIS — J189 Pneumonia, unspecified organism: Secondary | ICD-10-CM | POA: Diagnosis not present

## 2020-11-19 DIAGNOSIS — R059 Cough, unspecified: Secondary | ICD-10-CM | POA: Diagnosis not present

## 2020-11-19 DIAGNOSIS — Z20828 Contact with and (suspected) exposure to other viral communicable diseases: Secondary | ICD-10-CM | POA: Diagnosis not present

## 2020-11-19 DIAGNOSIS — R509 Fever, unspecified: Secondary | ICD-10-CM | POA: Diagnosis not present

## 2020-12-01 DIAGNOSIS — J209 Acute bronchitis, unspecified: Secondary | ICD-10-CM | POA: Diagnosis not present

## 2020-12-07 DIAGNOSIS — I208 Other forms of angina pectoris: Secondary | ICD-10-CM | POA: Diagnosis not present

## 2020-12-07 DIAGNOSIS — I10 Essential (primary) hypertension: Secondary | ICD-10-CM | POA: Diagnosis not present

## 2020-12-07 DIAGNOSIS — E785 Hyperlipidemia, unspecified: Secondary | ICD-10-CM | POA: Diagnosis not present

## 2021-01-11 ENCOUNTER — Other Ambulatory Visit: Payer: Self-pay

## 2021-01-11 ENCOUNTER — Ambulatory Visit (INDEPENDENT_AMBULATORY_CARE_PROVIDER_SITE_OTHER): Payer: Medicare Other | Admitting: Gastroenterology

## 2021-01-11 ENCOUNTER — Encounter (INDEPENDENT_AMBULATORY_CARE_PROVIDER_SITE_OTHER): Payer: Self-pay | Admitting: Gastroenterology

## 2021-01-11 VITALS — BP 154/92 | HR 80 | Temp 98.0°F | Ht 63.8 in | Wt 155.0 lb

## 2021-01-11 DIAGNOSIS — K257 Chronic gastric ulcer without hemorrhage or perforation: Secondary | ICD-10-CM | POA: Insufficient documentation

## 2021-01-11 DIAGNOSIS — K449 Diaphragmatic hernia without obstruction or gangrene: Secondary | ICD-10-CM

## 2021-01-11 DIAGNOSIS — D500 Iron deficiency anemia secondary to blood loss (chronic): Secondary | ICD-10-CM

## 2021-01-11 NOTE — Progress Notes (Signed)
Maylon Peppers, M.D. Gastroenterology & Hepatology [facility] [address] [place], [place] [address]  Primary Care Physician: Loa Socks, MD [place] [id number]  I will communicate my assessment and recommendations to the referring MD via EMR.  Problems: Iron deficiency anemia Cameron lesions Large hiatal hernia (8 cm)  History of Present Illness: GIAVANNI Garcia is a 70 y.o. female PMH GERD, DM, HTN, HLD, hyperparathyroidism s/p gland resection and large hiatal hernia complicated by [name] lesions, who presents for follow up of iron deficiency anemia.  Patient was last seen in the office on 07/23/2020.  At that time the patient was scheduled for EGD and colonoscopy given presence of iron deficiency anemia requiring IV iron infusion.  She was continued on omeprazole 40 mg every day.  Patient underwent both procedures on 08/19/2020, esophagogastroduodenospy showed an 8 cm hiatal hernia with [name] erosions, normal stomach otherwise and normal duodenum (biopsies negative for celiac disease).  Colonoscopy showed a normal terminal ileum with presence of diverticula in the sigmoid colon.  The patient underwent a coronary CTA which showed presence of stenosis in the mid LAD. Given these findings, the patient follow-up with cardiology, no heart cath was performed as she did not have any exertional symptoms and she was continued on medical therapy with Toprol, Imdur and aspirin 81 mg every day.  Patient reports feeling well.  She states that she takes omeprazole 40 mg every day.. If she does not take the medication she feels some retrosternal discomfort, usually happens when she forgets to take her medicine. However, she reports that she has other episodes of chest pain different from this 1 which are usually related to exertion, along with shortness of breath.    The patient states that very occasionally she has episodes of  dysphagia but this is very infrequent.  She is not interested in undergoing surgical correction of her hernia unless strictly necessary.  Has not had any recent blood work-up since the last time she came to clinic. She has received IV iron x2 in the past but is not taking any oral iron due to poor tolerance. Has some episodes of bloating and gas after eating intermittently. Has 1-2 Bms per day, most of the time is soft.  The patient denies having any nausea, vomiting, fever, chills, hematochezia, melena, hematemesis, abdominal pain, diarrhea, jaundice, pruritus or weight loss.  Last EGD: as above Last Colonoscopy: as above  Past Medical History: Past Medical History:  Diagnosis Date   Arthritis    Hands   Diabetes mellitus, type II ([place])    diet and exercise controlled   Elevated temperature    Notmal temp is 99-100   GERD (gastroesophageal reflux disease)    History of kidney stones    Hypercalcemia    Hyperlipidemia    Hypertension    Hyperthyroidism    Mild, remote history   Nephrolithiasis    Vitamin D deficiency     Past Surgical History: Past Surgical History:  Procedure Laterality Date   ABDOMINAL HYSTERECTOMY     bilateral breast biopsy     BIOPSY  08/19/2020   Procedure: BIOPSY;  Surgeon: Harvel Quale, MD;  Location: [facility];  Service: Gastroenterology;;  duodenum   COLONOSCOPY W/ POLYPECTOMY     COLONOSCOPY WITH PROPOFOL N/A 08/19/2020   Procedure: COLONOSCOPY WITH PROPOFOL;  Surgeon: Harvel Quale, MD;  Location: [facility];  Service: Gastroenterology;  Laterality: N/A;  7:30  ESOPHAGOGASTRODUODENOSCOPY (EGD) WITH PROPOFOL N/A 08/19/2020   Procedure: ESOPHAGOGASTRODUODENOSCOPY (EGD) WITH PROPOFOL;  Surgeon: Harvel Quale, MD;  Location: [facility];  Service: Gastroenterology;  Laterality: N/A;   PARATHYROIDECTOMY Right 02/01/2018   Procedure: RIGHT PARATHYROIDECTOMY;  Surgeon: Armandina Gemma, MD;  Location: [facility];   Service: General;  Laterality: Right;   TONSILLECTOMY      Family History: Family History  Problem Relation Age of Onset   Hypertension Mother    Hyperlipidemia Mother    Hyperlipidemia Father    Stroke Father    Cancer Father    Hypertension Sister    Hyperlipidemia Sister    Hypertension Brother    Hyperlipidemia Brother     Social History: Social History   Tobacco Use  Smoking Status Never  Smokeless Tobacco Never   Social History   Substance and Sexual Activity  Alcohol Use No   Social History   Substance and Sexual Activity  Drug Use No    Allergies: Allergies  Allergen Reactions   Lamisil [Terbinafine] Rash    Medications: Current Outpatient Medications  Medication Sig Dispense Refill   acetaminophen (TYLENOL) 650 MG CR tablet Take 650 mg by mouth every 8 (eight) hours as needed for pain.     aspirin 81 MG chewable tablet Chew 81 mg by mouth every evening.     atorvastatin (LIPITOR) 10 MG tablet Take 10 mg by mouth daily.     Cholecalciferol (VITAMIN D-3) 5000 units TABS Take 5,000 Units by mouth daily.     Cinnamon 500 MG TABS Take 500 mg by mouth daily.      lisinopril (PRINIVIL,ZESTRIL) 20 MG tablet Take 20 mg by mouth daily.     metoprolol succinate (TOPROL-XL) 25 MG 24 hr tablet Take 25 mg by mouth daily.     Multiple Vitamin (MULTIVITAMIN WITH MINERALS) TABS tablet Take 1 tablet by mouth daily.     omeprazole (PRILOSEC) 40 MG capsule Take 1 capsule (40 mg total) by mouth daily. 90 capsule 3   No current facility-administered medications for this visit.    Review of Systems: GENERAL: negative for malaise, night sweats HEENT: No changes in hearing or vision, no nose bleeds or other nasal problems. NECK: Negative for lumps, goiter, pain and significant neck swelling RESPIRATORY: Negative for cough, wheezing CARDIOVASCULAR: Negative for chest pain, leg swelling, palpitations, orthopnea GI: SEE HPI MUSCULOSKELETAL: Negative for joint pain or  swelling, back pain, and muscle pain. SKIN: Negative for lesions, rash PSYCH: Negative for sleep disturbance, mood disorder and recent psychosocial stressors. HEMATOLOGY Negative for prolonged bleeding, bruising easily, and swollen nodes. ENDOCRINE: Negative for cold or heat intolerance, polyuria, polydipsia and goiter. NEURO: negative for tremor, gait imbalance, syncope and seizures. The remainder of the review of systems is noncontributory.   Physical Exam: BP (!) 154/92 (BP Location: Right Arm, Patient Position: Sitting, Cuff Size: Large)   Pulse 80   Temp 98 F (36.7 C) (Oral)   Ht 5' 3.8" (1.621 m)   Wt 155 lb (70.3 kg)   BMI 26.77 kg/m  GENERAL: The patient is AO x3, in no acute distress. HEENT: Head is normocephalic and atraumatic. EOMI are intact. Mouth is well hydrated and without lesions. NECK: Supple. No masses LUNGS: Clear to auscultation. No presence of rhonchi/wheezing/rales. Adequate chest expansion HEART: RRR, normal s1 and s2. ABDOMEN: Soft, nontender, no guarding, no peritoneal signs, and nondistended. BS +. No masses. EXTREMITIES: Without any cyanosis, clubbing, rash, lesions or edema. NEUROLOGIC: AOx3, no focal motor deficit.  SKIN: no jaundice, no rashes  Imaging/Labs: as above  I personally reviewed and interpreted the available labs, imaging and endoscopic files.  Impression and Plan: Beth Garcia is a 70 y.o. female PMH GERD, DM, HTN, HLD, hyperparathyroidism s/p gland resection and large hiatal hernia complicated by [name] lesions, who presents for follow up of iron deficiency anemia.  The patient likely has presented iron deficiency anemia due to chronic losses from her [name] lesions.  It is unclear if she has responded to her IV iron, for which we will check a CBC and iron stores.  If her hemoglobin is stable and her iron stores are better, she can continue iron infusions as needed as she is not interested to pursue any surgical repair of her hernia  unless strictly necessary which I consider adequate.  She has presented some episodes of discomfort in her chest when she stops taking her PPI, which could be secondary to GERD, for which she will need to keep taking her PPI indefinitely.  However, I advised the patient to follow-up with her cardiologist as some of her other episodes of chest pain are related to exertion, along with shortness of breath. Patient understood and agreed.  - Check CBC and iron stores - Continue omeprazole 40 mg qday - Follow up with cardiology  All questions were answered.      Harvel Quale, MD Gastroenterology and Hepatology [facility]

## 2021-01-11 NOTE — Patient Instructions (Addendum)
Perform blood workup Continue omeprazole 40 mg qday

## 2021-01-12 LAB — CBC WITH DIFFERENTIAL/PLATELET
Absolute Monocytes: 715 cells/uL (ref 200–950)
Basophils Absolute: 51 cells/uL (ref 0–200)
Basophils Relative: 0.7 %
Eosinophils Absolute: 263 cells/uL (ref 15–500)
Eosinophils Relative: 3.6 %
HCT: 38.7 % (ref 35.0–45.0)
Hemoglobin: 12.6 g/dL (ref 11.7–15.5)
Lymphs Abs: 1402 cells/uL (ref 850–3900)
MCH: 28.3 pg (ref 27.0–33.0)
MCHC: 32.6 g/dL (ref 32.0–36.0)
MCV: 87 fL (ref 80.0–100.0)
MPV: 12.4 fL (ref 7.5–12.5)
Monocytes Relative: 9.8 %
Neutro Abs: 4869 cells/uL (ref 1500–7800)
Neutrophils Relative %: 66.7 %
Platelets: 245 10*3/uL (ref 140–400)
RBC: 4.45 10*6/uL (ref 3.80–5.10)
RDW: 13.4 % (ref 11.0–15.0)
Total Lymphocyte: 19.2 %
WBC: 7.3 10*3/uL (ref 3.8–10.8)

## 2021-01-12 LAB — IRON, TOTAL/TOTAL IRON BINDING CAP
%SAT: 14 % (calc) — ABNORMAL LOW (ref 16–45)
Iron: 52 ug/dL (ref 45–160)
TIBC: 385 mcg/dL (calc) (ref 250–450)

## 2021-01-12 LAB — FERRITIN: Ferritin: 7 ng/mL — ABNORMAL LOW (ref 16–288)

## 2021-02-17 DIAGNOSIS — D649 Anemia, unspecified: Secondary | ICD-10-CM | POA: Diagnosis not present

## 2021-02-17 DIAGNOSIS — E7849 Other hyperlipidemia: Secondary | ICD-10-CM | POA: Diagnosis not present

## 2021-02-17 DIAGNOSIS — E782 Mixed hyperlipidemia: Secondary | ICD-10-CM | POA: Diagnosis not present

## 2021-02-17 DIAGNOSIS — E1165 Type 2 diabetes mellitus with hyperglycemia: Secondary | ICD-10-CM | POA: Diagnosis not present

## 2021-02-17 DIAGNOSIS — E210 Primary hyperparathyroidism: Secondary | ICD-10-CM | POA: Diagnosis not present

## 2021-02-17 DIAGNOSIS — K219 Gastro-esophageal reflux disease without esophagitis: Secondary | ICD-10-CM | POA: Diagnosis not present

## 2021-02-17 DIAGNOSIS — D519 Vitamin B12 deficiency anemia, unspecified: Secondary | ICD-10-CM | POA: Diagnosis not present

## 2021-02-17 DIAGNOSIS — I10 Essential (primary) hypertension: Secondary | ICD-10-CM | POA: Diagnosis not present

## 2021-02-17 DIAGNOSIS — D529 Folate deficiency anemia, unspecified: Secondary | ICD-10-CM | POA: Diagnosis not present

## 2021-02-23 DIAGNOSIS — R002 Palpitations: Secondary | ICD-10-CM | POA: Diagnosis not present

## 2021-02-23 DIAGNOSIS — Z0001 Encounter for general adult medical examination with abnormal findings: Secondary | ICD-10-CM | POA: Diagnosis not present

## 2021-02-23 DIAGNOSIS — E1169 Type 2 diabetes mellitus with other specified complication: Secondary | ICD-10-CM | POA: Diagnosis not present

## 2021-02-23 DIAGNOSIS — D509 Iron deficiency anemia, unspecified: Secondary | ICD-10-CM | POA: Diagnosis not present

## 2021-02-23 DIAGNOSIS — Z6827 Body mass index (BMI) 27.0-27.9, adult: Secondary | ICD-10-CM | POA: Diagnosis not present

## 2021-02-23 DIAGNOSIS — E7849 Other hyperlipidemia: Secondary | ICD-10-CM | POA: Diagnosis not present

## 2021-02-23 DIAGNOSIS — M810 Age-related osteoporosis without current pathological fracture: Secondary | ICD-10-CM | POA: Diagnosis not present

## 2021-02-23 DIAGNOSIS — I10 Essential (primary) hypertension: Secondary | ICD-10-CM | POA: Diagnosis not present

## 2021-03-11 DIAGNOSIS — E785 Hyperlipidemia, unspecified: Secondary | ICD-10-CM | POA: Diagnosis not present

## 2021-03-11 DIAGNOSIS — I208 Other forms of angina pectoris: Secondary | ICD-10-CM | POA: Diagnosis not present

## 2021-03-11 DIAGNOSIS — I10 Essential (primary) hypertension: Secondary | ICD-10-CM | POA: Diagnosis not present

## 2021-03-29 DIAGNOSIS — Z20828 Contact with and (suspected) exposure to other viral communicable diseases: Secondary | ICD-10-CM | POA: Diagnosis not present

## 2021-04-08 DIAGNOSIS — H109 Unspecified conjunctivitis: Secondary | ICD-10-CM | POA: Diagnosis not present

## 2021-05-11 DIAGNOSIS — Z20828 Contact with and (suspected) exposure to other viral communicable diseases: Secondary | ICD-10-CM | POA: Diagnosis not present

## 2021-05-12 DIAGNOSIS — Z23 Encounter for immunization: Secondary | ICD-10-CM | POA: Diagnosis not present

## 2021-06-08 DIAGNOSIS — E785 Hyperlipidemia, unspecified: Secondary | ICD-10-CM | POA: Diagnosis not present

## 2021-06-08 DIAGNOSIS — I25118 Atherosclerotic heart disease of native coronary artery with other forms of angina pectoris: Secondary | ICD-10-CM | POA: Diagnosis not present

## 2021-06-08 DIAGNOSIS — I10 Essential (primary) hypertension: Secondary | ICD-10-CM | POA: Diagnosis not present

## 2021-06-08 DIAGNOSIS — D500 Iron deficiency anemia secondary to blood loss (chronic): Secondary | ICD-10-CM | POA: Diagnosis not present

## 2021-06-09 DIAGNOSIS — R059 Cough, unspecified: Secondary | ICD-10-CM | POA: Diagnosis not present

## 2021-06-09 DIAGNOSIS — Z20828 Contact with and (suspected) exposure to other viral communicable diseases: Secondary | ICD-10-CM | POA: Diagnosis not present

## 2021-06-09 DIAGNOSIS — J329 Chronic sinusitis, unspecified: Secondary | ICD-10-CM | POA: Diagnosis not present

## 2021-06-10 DIAGNOSIS — Z20828 Contact with and (suspected) exposure to other viral communicable diseases: Secondary | ICD-10-CM | POA: Diagnosis not present

## 2021-06-15 DIAGNOSIS — H2513 Age-related nuclear cataract, bilateral: Secondary | ICD-10-CM | POA: Diagnosis not present

## 2021-07-13 DIAGNOSIS — Z20828 Contact with and (suspected) exposure to other viral communicable diseases: Secondary | ICD-10-CM | POA: Diagnosis not present

## 2021-07-20 DIAGNOSIS — D649 Anemia, unspecified: Secondary | ICD-10-CM | POA: Diagnosis not present

## 2021-07-20 DIAGNOSIS — E7849 Other hyperlipidemia: Secondary | ICD-10-CM | POA: Diagnosis not present

## 2021-07-20 DIAGNOSIS — E782 Mixed hyperlipidemia: Secondary | ICD-10-CM | POA: Diagnosis not present

## 2021-07-20 DIAGNOSIS — D519 Vitamin B12 deficiency anemia, unspecified: Secondary | ICD-10-CM | POA: Diagnosis not present

## 2021-07-20 DIAGNOSIS — E1165 Type 2 diabetes mellitus with hyperglycemia: Secondary | ICD-10-CM | POA: Diagnosis not present

## 2021-07-20 DIAGNOSIS — I10 Essential (primary) hypertension: Secondary | ICD-10-CM | POA: Diagnosis not present

## 2021-07-20 DIAGNOSIS — K219 Gastro-esophageal reflux disease without esophagitis: Secondary | ICD-10-CM | POA: Diagnosis not present

## 2021-07-20 DIAGNOSIS — D529 Folate deficiency anemia, unspecified: Secondary | ICD-10-CM | POA: Diagnosis not present

## 2021-07-26 ENCOUNTER — Encounter (INDEPENDENT_AMBULATORY_CARE_PROVIDER_SITE_OTHER): Payer: Self-pay | Admitting: *Deleted

## 2021-07-26 DIAGNOSIS — M810 Age-related osteoporosis without current pathological fracture: Secondary | ICD-10-CM | POA: Diagnosis not present

## 2021-07-26 DIAGNOSIS — I10 Essential (primary) hypertension: Secondary | ICD-10-CM | POA: Diagnosis not present

## 2021-07-26 DIAGNOSIS — E1169 Type 2 diabetes mellitus with other specified complication: Secondary | ICD-10-CM | POA: Diagnosis not present

## 2021-07-26 DIAGNOSIS — D509 Iron deficiency anemia, unspecified: Secondary | ICD-10-CM | POA: Diagnosis not present

## 2021-07-26 DIAGNOSIS — E7849 Other hyperlipidemia: Secondary | ICD-10-CM | POA: Diagnosis not present

## 2021-07-26 DIAGNOSIS — R002 Palpitations: Secondary | ICD-10-CM | POA: Diagnosis not present

## 2021-07-26 DIAGNOSIS — Z6828 Body mass index (BMI) 28.0-28.9, adult: Secondary | ICD-10-CM | POA: Diagnosis not present

## 2021-07-28 ENCOUNTER — Telehealth (INDEPENDENT_AMBULATORY_CARE_PROVIDER_SITE_OTHER): Payer: Self-pay | Admitting: Gastroenterology

## 2021-07-28 DIAGNOSIS — D649 Anemia, unspecified: Secondary | ICD-10-CM | POA: Diagnosis not present

## 2021-07-28 NOTE — Telephone Encounter (Signed)
I called the patient to inform about the results of recent blood testing performed on 07/20/2021 which showed hemoglobin of 10.4, MCV of 76, iron of 20, ferritin of 6, iron saturation 5% and TIBC of 429 consistent with persistent iron deficiency anemia.  It is very possible she is presenting with anemia due to her [name] ulcers and large hiatal hernia.  However, she would like to proceed with capsule endoscopy prior to committing to surgery which I find reasonable.  We will set up a capsule endoscopy.  The patient understood and agreed.  Hi Darius Bump,   Can you please schedule a capsule endoscopy? Dx: iron deficiency anemia.  Thanks,   Maylon Peppers, MD Gastroenterology and Hepatology [facility]

## 2021-07-29 ENCOUNTER — Encounter (INDEPENDENT_AMBULATORY_CARE_PROVIDER_SITE_OTHER): Payer: Self-pay

## 2021-08-02 ENCOUNTER — Other Ambulatory Visit (INDEPENDENT_AMBULATORY_CARE_PROVIDER_SITE_OTHER): Payer: Self-pay | Admitting: Gastroenterology

## 2021-08-04 DIAGNOSIS — D649 Anemia, unspecified: Secondary | ICD-10-CM | POA: Diagnosis not present

## 2021-08-11 ENCOUNTER — Encounter (HOSPITAL_COMMUNITY)
Admission: RE | Disposition: A | Discharge status: Home / Self Care | Payer: Self-pay | Source: Home / Self Care | Attending: Gastroenterology

## 2021-08-11 ENCOUNTER — Ambulatory Visit (HOSPITAL_COMMUNITY)
Admission: RE | Admit: 2021-08-11 | Discharge: 2021-08-11 | Disposition: A | Discharge status: Home / Self Care | Payer: Medicare Other | Attending: Gastroenterology | Admitting: Gastroenterology

## 2021-08-11 DIAGNOSIS — Z9889 Other specified postprocedural states: Secondary | ICD-10-CM

## 2021-08-11 DIAGNOSIS — Z79899 Other long term (current) drug therapy: Secondary | ICD-10-CM | POA: Insufficient documentation

## 2021-08-11 DIAGNOSIS — K449 Diaphragmatic hernia without obstruction or gangrene: Secondary | ICD-10-CM | POA: Insufficient documentation

## 2021-08-11 DIAGNOSIS — D500 Iron deficiency anemia secondary to blood loss (chronic): Secondary | ICD-10-CM | POA: Diagnosis not present

## 2021-08-11 HISTORY — PX: GIVENS CAPSULE STUDY: SHX5432

## 2021-08-11 SURGERY — IMAGING PROCEDURE, GI TRACT, INTRALUMINAL, VIA CAPSULE

## 2021-08-12 ENCOUNTER — Encounter (HOSPITAL_COMMUNITY): Payer: Self-pay | Admitting: Gastroenterology

## 2021-08-12 NOTE — Procedures (Signed)
Small Bowel Givens Capsule Study Procedure date:  08/11/2021  Referring Provider:  PCP PCP:  Dr. Caryl Bis, MD  Indication for procedure:  Iron deficiency anemia  Findings:  Adequate study as there was presence of adequate prep and the endoscopic capsule reached the cecum.  There was no presence of hematin in the GI tract or other lesions.  First Gastric image:  00:02:08 First Duodenal image: 00:10:43 First Cecal image: 05:02:44 Gastric Passage time: 0h 72m Small Bowel Passage time: 4h 22m    Summary & Recommendations: There was no presence of hematin in the GI tract or other lesions - normal capsule endoscopy.  Anemia related to intermittent bleeding from [facility] ulcers.  - Referral to [facility] for surgical correction of large hiatal hernia. - Continue omeprazole 40 mg qday - Continue iron supplementation  I personally communicated these recommendations to the patient  Maylon Peppers, MD Gastroenterology and Hepatology [facility]

## 2021-08-20 ENCOUNTER — Encounter (INDEPENDENT_AMBULATORY_CARE_PROVIDER_SITE_OTHER): Payer: Self-pay

## 2021-08-21 DIAGNOSIS — L989 Disorder of the skin and subcutaneous tissue, unspecified: Secondary | ICD-10-CM | POA: Diagnosis not present

## 2021-08-21 DIAGNOSIS — C44622 Squamous cell carcinoma of skin of right upper limb, including shoulder: Secondary | ICD-10-CM | POA: Diagnosis not present

## 2021-09-01 DIAGNOSIS — K449 Diaphragmatic hernia without obstruction or gangrene: Secondary | ICD-10-CM | POA: Diagnosis not present

## 2021-09-01 DIAGNOSIS — D500 Iron deficiency anemia secondary to blood loss (chronic): Secondary | ICD-10-CM | POA: Diagnosis not present

## 2021-09-01 DIAGNOSIS — K257 Chronic gastric ulcer without hemorrhage or perforation: Secondary | ICD-10-CM | POA: Diagnosis not present

## 2021-09-01 DIAGNOSIS — E119 Type 2 diabetes mellitus without complications: Secondary | ICD-10-CM | POA: Diagnosis not present

## 2021-09-01 DIAGNOSIS — I25118 Atherosclerotic heart disease of native coronary artery with other forms of angina pectoris: Secondary | ICD-10-CM | POA: Diagnosis not present

## 2021-09-06 DIAGNOSIS — D529 Folate deficiency anemia, unspecified: Secondary | ICD-10-CM | POA: Diagnosis not present

## 2021-09-06 DIAGNOSIS — D649 Anemia, unspecified: Secondary | ICD-10-CM | POA: Diagnosis not present

## 2021-09-06 DIAGNOSIS — D519 Vitamin B12 deficiency anemia, unspecified: Secondary | ICD-10-CM | POA: Diagnosis not present

## 2021-09-07 DIAGNOSIS — Z20828 Contact with and (suspected) exposure to other viral communicable diseases: Secondary | ICD-10-CM | POA: Diagnosis not present

## 2021-09-07 DIAGNOSIS — I25118 Atherosclerotic heart disease of native coronary artery with other forms of angina pectoris: Secondary | ICD-10-CM | POA: Diagnosis not present

## 2021-09-07 DIAGNOSIS — I10 Essential (primary) hypertension: Secondary | ICD-10-CM | POA: Diagnosis not present

## 2021-09-07 DIAGNOSIS — E785 Hyperlipidemia, unspecified: Secondary | ICD-10-CM | POA: Diagnosis not present

## 2021-09-14 ENCOUNTER — Ambulatory Visit (INDEPENDENT_AMBULATORY_CARE_PROVIDER_SITE_OTHER): Payer: Medicare Other | Admitting: Gastroenterology

## 2021-09-14 ENCOUNTER — Other Ambulatory Visit (HOSPITAL_COMMUNITY): Payer: Self-pay | Admitting: General Surgery

## 2021-09-14 DIAGNOSIS — I25118 Atherosclerotic heart disease of native coronary artery with other forms of angina pectoris: Secondary | ICD-10-CM

## 2021-09-14 DIAGNOSIS — K257 Chronic gastric ulcer without hemorrhage or perforation: Secondary | ICD-10-CM

## 2021-09-14 DIAGNOSIS — K449 Diaphragmatic hernia without obstruction or gangrene: Secondary | ICD-10-CM

## 2021-09-14 DIAGNOSIS — D500 Iron deficiency anemia secondary to blood loss (chronic): Secondary | ICD-10-CM

## 2021-09-20 ENCOUNTER — Other Ambulatory Visit: Payer: Self-pay

## 2021-09-20 ENCOUNTER — Ambulatory Visit (HOSPITAL_COMMUNITY)
Admission: RE | Admit: 2021-09-20 | Discharge: 2021-09-20 | Disposition: A | Discharge status: Home / Self Care | Payer: Medicare Other | Source: Ambulatory Visit | Attending: General Surgery | Admitting: General Surgery

## 2021-09-20 DIAGNOSIS — K449 Diaphragmatic hernia without obstruction or gangrene: Secondary | ICD-10-CM | POA: Diagnosis not present

## 2021-09-20 DIAGNOSIS — I25118 Atherosclerotic heart disease of native coronary artery with other forms of angina pectoris: Secondary | ICD-10-CM | POA: Insufficient documentation

## 2021-09-20 DIAGNOSIS — K257 Chronic gastric ulcer without hemorrhage or perforation: Secondary | ICD-10-CM | POA: Diagnosis not present

## 2021-09-20 DIAGNOSIS — D500 Iron deficiency anemia secondary to blood loss (chronic): Secondary | ICD-10-CM | POA: Diagnosis not present

## 2021-09-20 DIAGNOSIS — N281 Cyst of kidney, acquired: Secondary | ICD-10-CM | POA: Diagnosis not present

## 2021-09-20 DIAGNOSIS — K760 Fatty (change of) liver, not elsewhere classified: Secondary | ICD-10-CM | POA: Diagnosis not present

## 2021-09-20 LAB — POCT I-STAT CREATININE: Creatinine, Ser: 1 mg/dL (ref 0.44–1.00)

## 2021-09-20 MED ORDER — IOHEXOL 300 MG/ML  SOLN
100.0000 mL | Freq: Once | INTRAMUSCULAR | Status: AC | PRN
  Administered 2021-09-20: 100 mL via INTRAVENOUS

## 2021-09-23 DIAGNOSIS — D500 Iron deficiency anemia secondary to blood loss (chronic): Secondary | ICD-10-CM | POA: Diagnosis not present

## 2021-09-23 DIAGNOSIS — K449 Diaphragmatic hernia without obstruction or gangrene: Secondary | ICD-10-CM | POA: Diagnosis not present

## 2021-09-23 DIAGNOSIS — E119 Type 2 diabetes mellitus without complications: Secondary | ICD-10-CM | POA: Diagnosis not present

## 2021-09-23 DIAGNOSIS — K257 Chronic gastric ulcer without hemorrhage or perforation: Secondary | ICD-10-CM | POA: Diagnosis not present

## 2021-09-23 DIAGNOSIS — I25118 Atherosclerotic heart disease of native coronary artery with other forms of angina pectoris: Secondary | ICD-10-CM | POA: Diagnosis not present

## 2021-11-03 DIAGNOSIS — D529 Folate deficiency anemia, unspecified: Secondary | ICD-10-CM | POA: Diagnosis not present

## 2021-11-03 DIAGNOSIS — D649 Anemia, unspecified: Secondary | ICD-10-CM | POA: Diagnosis not present

## 2021-11-03 DIAGNOSIS — D519 Vitamin B12 deficiency anemia, unspecified: Secondary | ICD-10-CM | POA: Diagnosis not present

## 2021-11-09 ENCOUNTER — Ambulatory Visit: Payer: Self-pay | Admitting: General Surgery

## 2021-11-09 DIAGNOSIS — D500 Iron deficiency anemia secondary to blood loss (chronic): Secondary | ICD-10-CM

## 2021-11-09 NOTE — Progress Notes (Signed)
Sent message, via epic in basket, requesting orders in epic from surgeon.  

## 2021-11-11 NOTE — Progress Notes (Addendum)
DUE TO COVID-19 ONLY  2  VISITOR IS ALLOWED TO COME WITH YOU AND STAY IN THE WAITING ROOM ONLY DURING PRE OP AND PROCEDURE DAY OF SURGERY.   4  VISITOR  MAY VISIT WITH YOU AFTER SURGERY IN YOUR PRIVATE ROOM DURING VISITING HOURS ONLY! ?YOU MAY HAVE ONE PERSON SPEND THE NITE WITH YOU IN YOUR ROOM AFTER SURGERY.   ? ? ? ? Your procedure is scheduled on:  ?                   12/06/2021  ? Report to [facility] Main  Entrance ? ? Report to admitting at       0830          AM ?DO NOT BRING INSURANCE CARD, PICTURE ID OR WALLET DAY OF SURGERY.  ?  ? ? Call this number if you have problems the morning of surgery [phone]  ? ? REMEMBER: NO  SOLID FOODS , CANDY, GUM OR MINTS AFTER MIDNITE THE NITE BEFORE SURGERY .       Marland Kitchen CLEAR LIQUIDS UNTIL   0745am              DAY OF SURGERY.    ? ? ? ?CLEAR LIQUID DIET ? ? ?Foods Allowed      ?WATER ?BLACK COFFEE ( SUGAR OK, NO MILK, CREAM OR CREAMER) REGULAR AND DECAF  ?TEA ( SUGAR OK NO MILK, CREAM, OR CREAMER) REGULAR AND DECAF  ?PLAIN JELLO ( NO RED)  ?FRUIT ICES ( NO RED, NO FRUIT PULP)  ?POPSICLES ( NO RED)  ?JUICE- APPLE, WHITE GRAPE AND WHITE CRANBERRY  ?SPORT DRINK LIKE GATORADE ( NO RED)  ?CLEAR BROTH ( VEGETABLE , CHICKEN OR BEEF)                                                               ? ?    ? ?BRUSH YOUR TEETH MORNING OF SURGERY AND RINSE YOUR MOUTH OUT, NO CHEWING GUM CANDY OR MINTS. ?  ? ? Take these medicines the morning of surgery with A SIP OF WATER:  toprol, Prilosec  ? ? ?DO NOT TAKE ANY DIABETIC MEDICATIONS DAY OF YOUR SURGERY ?                  ?            You may not have any metal on your body including hair pins and  ?            piercings  Do not wear jewelry, make-up, lotions, powders or perfumes, deodorant ?            Do not wear nail polish on your fingernails.   ?           IF YOU ARE A FEMALE AND WANT TO SHAVE UNDER ARMS OR LEGS PRIOR TO SURGERY YOU MUST DO SO AT LEAST 48 HOURS PRIOR TO SURGERY.  ?            Men may shave face and  neck. ? ? Do not bring valuables to the hospital. [place] NOT ?            RESPONSIBLE   FOR VALUABLES. ? Contacts, dentures or bridgework may not be  worn into surgery. ? Leave suitcase in the car. After surgery it may be brought to your room. ? ?  ? Patients discharged the day of surgery will not be allowed to drive home. IF YOU ARE HAVING SURGERY AND GOING HOME THE SAME DAY, YOU MUST HAVE AN ADULT TO DRIVE YOU HOME AND BE WITH YOU FOR 24 HOURS. YOU MAY GO HOME BY TAXI OR UBER OR ORTHERWISE, BUT AN ADULT MUST ACCOMPANY YOU HOME AND STAY WITH YOU FOR 24 HOURS. ?  ? ?            Please read over the following fact sheets you were given: ?_____________________________________________________________________ ? ?[place] - Preparing for Surgery ?Before surgery, you can play an important role.  Because skin is not sterile, your skin needs to be as free of germs as possible.  You can reduce the number of germs on your skin by washing with CHG (chlorahexidine gluconate) soap before surgery.  CHG is an antiseptic cleaner which kills germs and bonds with the skin to continue killing germs even after washing. ?Please DO NOT use if you have an allergy to CHG or antibacterial soaps.  If your skin becomes reddened/irritated stop using the CHG and inform your nurse when you arrive at Short Stay. ?Do not shave (including legs and underarms) for at least 48 hours prior to the first CHG shower.  You may shave your face/neck. ?Please follow these instructions carefully: ? 1.  Shower with CHG Soap the night before surgery and the  morning of Surgery. ? 2.  If you choose to wash your hair, wash your hair first as usual with your  normal  shampoo. ? 3.  After you shampoo, rinse your hair and body thoroughly to remove the  shampoo.                           4.  Use CHG as you would any other liquid soap.  You can apply chg directly  to the skin and wash  ?                     Gently with a scrungie or clean washcloth. ? 5.   Apply the CHG Soap to your body ONLY FROM THE NECK DOWN.   Do not use on face/ open      ?                     Wound or open sores. Avoid contact with eyes, ears mouth and genitals (private parts).  ?                     Production manager,  Genitals (private parts) with your normal soap. ?            6.  Wash thoroughly, paying special attention to the area where your surgery  will be performed. ? 7.  Thoroughly rinse your body with warm water from the neck down. ? 8.  DO NOT shower/wash with your normal soap after using and rinsing off  the CHG Soap. ?               9.  Pat yourself dry with a clean towel. ?           10.  Wear clean pajamas. ?           11.  Place clean sheets on your bed the  night of your first shower and do not  sleep with pets. ?Day of Surgery : ?Do not apply any lotions/deodorants the morning of surgery.  Please wear clean clothes to the hospital/surgery center. ? ?FAILURE TO FOLLOW THESE INSTRUCTIONS MAY RESULT IN THE CANCELLATION OF YOUR SURGERY ?PATIENT SIGNATURE_________________________________ ? ?NURSE SIGNATURE__________________________________ ? ?________________________________________________________________________  ? ? ?           ?

## 2021-11-11 NOTE — Progress Notes (Addendum)
Anesthesia Review: ? ?PCP: Kern Alberta - [facility]  ?Cardiologist : DR Rosalyn Gess Assar LOV 09/07/2021  ?Chest x-ray : ?EKG : 09/07/2021  - have requested 12 lead ekg they are to fax from  cardiology.  On chart  ?CT Chest- 09/23/21  ?CT cors- 09/28/20 reports in ov note of 09/07/21  ?Echo : ?Stress test: ?Cardiac Cath :  ?Activity level: can do a flight of stairs without difficulty  ?Sleep Study/ CPAP : none  ?Fasting Blood Sugar :      / Checks Blood Sugar -- times a day:   ?Blood Thinner/ Instructions /Last Dose: ?ASA / Instructions/ Last Dose :   ?81 mg aspirin  ?DM- type 2 diet and exercise controlled per pt on no meds  ?Hgba1c-11/16/21- 6.5  ?PT has recent hx of 2 iron transfusions managed by DR Kern Alberta.   ?PT reports at preop appt that she tends to run a slight fever after surgeries .  ?

## 2021-11-16 ENCOUNTER — Encounter (HOSPITAL_COMMUNITY)
Admission: RE | Admit: 2021-11-16 | Discharge: 2021-11-16 | Disposition: A | Discharge status: Home / Self Care | Payer: Medicare Other | Source: Ambulatory Visit | Attending: Psychologist | Admitting: Psychologist

## 2021-11-16 ENCOUNTER — Encounter (HOSPITAL_COMMUNITY): Payer: Self-pay

## 2021-11-16 ENCOUNTER — Other Ambulatory Visit: Payer: Self-pay

## 2021-11-16 VITALS — BP 156/88 | HR 69 | Temp 98.6°F | Resp 16 | Ht 64.0 in | Wt 162.0 lb

## 2021-11-16 DIAGNOSIS — D500 Iron deficiency anemia secondary to blood loss (chronic): Secondary | ICD-10-CM | POA: Diagnosis not present

## 2021-11-16 DIAGNOSIS — Z01812 Encounter for preprocedural laboratory examination: Secondary | ICD-10-CM | POA: Diagnosis not present

## 2021-11-16 DIAGNOSIS — Z01818 Encounter for other preprocedural examination: Secondary | ICD-10-CM

## 2021-11-16 DIAGNOSIS — E139 Other specified diabetes mellitus without complications: Secondary | ICD-10-CM | POA: Diagnosis not present

## 2021-11-16 HISTORY — DX: Dyspnea, unspecified: R06.00

## 2021-11-16 HISTORY — DX: Anemia, unspecified: D64.9

## 2021-11-16 LAB — CBC WITH DIFFERENTIAL/PLATELET
Abs Immature Granulocytes: 0.05 10*3/uL (ref 0.00–0.07)
Basophils Absolute: 0.1 10*3/uL (ref 0.0–0.1)
Basophils Relative: 1 %
Eosinophils Absolute: 0.2 10*3/uL (ref 0.0–0.5)
Eosinophils Relative: 3 %
HCT: 39.6 % (ref 36.0–46.0)
Hemoglobin: 13.2 g/dL (ref 12.0–15.0)
Immature Granulocytes: 1 %
Lymphocytes Relative: 18 %
Lymphs Abs: 1.3 10*3/uL (ref 0.7–4.0)
MCH: 29.9 pg (ref 26.0–34.0)
MCHC: 33.3 g/dL (ref 30.0–36.0)
MCV: 89.8 fL (ref 80.0–100.0)
Monocytes Absolute: 0.6 10*3/uL (ref 0.1–1.0)
Monocytes Relative: 9 %
Neutro Abs: 4.9 10*3/uL (ref 1.7–7.7)
Neutrophils Relative %: 68 %
Platelets: 213 10*3/uL (ref 150–400)
RBC: 4.41 MIL/uL (ref 3.87–5.11)
RDW: 13.7 % (ref 11.5–15.5)
WBC: 7.2 10*3/uL (ref 4.0–10.5)
nRBC: 0 % (ref 0.0–0.2)

## 2021-11-16 LAB — COMPREHENSIVE METABOLIC PANEL
ALT: 19 U/L (ref 0–44)
AST: 20 U/L (ref 15–41)
Albumin: 4 g/dL (ref 3.5–5.0)
Alkaline Phosphatase: 80 U/L (ref 38–126)
Anion gap: 5 (ref 5–15)
BUN: 16 mg/dL (ref 8–23)
CO2: 27 mmol/L (ref 22–32)
Calcium: 9.4 mg/dL (ref 8.9–10.3)
Chloride: 110 mmol/L (ref 98–111)
Creatinine, Ser: 0.86 mg/dL (ref 0.44–1.00)
GFR, Estimated: >60 mL/min (ref >60)
Glucose, Bld: 123 mg/dL — ABNORMAL HIGH (ref 70–99)
Potassium: 4.2 mmol/L (ref 3.5–5.1)
Sodium: 142 mmol/L (ref 135–145)
Total Bilirubin: 1 mg/dL (ref 0.3–1.2)
Total Protein: 7.1 g/dL (ref 6.5–8.1)

## 2021-11-16 LAB — TYPE AND SCREEN
ABO/RH(D): O NEG
Antibody Screen: NEGATIVE

## 2021-11-16 LAB — HEMOGLOBIN A1C
Hgb A1c MFr Bld: 6.5 % — ABNORMAL HIGH (ref 4.8–5.6)
Mean Plasma Glucose: 139.85 mg/dL

## 2021-11-16 LAB — GLUCOSE, CAPILLARY: Glucose-Capillary: 131 mg/dL — ABNORMAL HIGH (ref 70–99)

## 2021-12-06 ENCOUNTER — Ambulatory Visit (HOSPITAL_BASED_OUTPATIENT_CLINIC_OR_DEPARTMENT_OTHER): Payer: Medicare Other | Admitting: Certified Registered"

## 2021-12-06 ENCOUNTER — Observation Stay (HOSPITAL_COMMUNITY)
Admission: RE | Admit: 2021-12-06 | Discharge: 2021-12-07 | Disposition: A | Discharge status: Home / Self Care | Payer: Medicare Other | Source: Ambulatory Visit | Attending: General Surgery | Admitting: General Surgery

## 2021-12-06 ENCOUNTER — Other Ambulatory Visit: Payer: Self-pay

## 2021-12-06 ENCOUNTER — Ambulatory Visit (HOSPITAL_COMMUNITY): Payer: Medicare Other | Admitting: Physician Assistant

## 2021-12-06 ENCOUNTER — Encounter (HOSPITAL_COMMUNITY)
Admission: RE | Disposition: A | Discharge status: Home / Self Care | Payer: Self-pay | Source: Ambulatory Visit | Attending: General Surgery

## 2021-12-06 ENCOUNTER — Encounter (HOSPITAL_COMMUNITY): Payer: Self-pay | Admitting: General Surgery

## 2021-12-06 DIAGNOSIS — I25118 Atherosclerotic heart disease of native coronary artery with other forms of angina pectoris: Secondary | ICD-10-CM | POA: Diagnosis not present

## 2021-12-06 DIAGNOSIS — Z8719 Personal history of other diseases of the digestive system: Secondary | ICD-10-CM

## 2021-12-06 DIAGNOSIS — I10 Essential (primary) hypertension: Secondary | ICD-10-CM | POA: Insufficient documentation

## 2021-12-06 DIAGNOSIS — K254 Chronic or unspecified gastric ulcer with hemorrhage: Secondary | ICD-10-CM | POA: Diagnosis not present

## 2021-12-06 DIAGNOSIS — Z01818 Encounter for other preprocedural examination: Secondary | ICD-10-CM

## 2021-12-06 DIAGNOSIS — E119 Type 2 diabetes mellitus without complications: Secondary | ICD-10-CM | POA: Diagnosis not present

## 2021-12-06 DIAGNOSIS — K449 Diaphragmatic hernia without obstruction or gangrene: Principal | ICD-10-CM | POA: Insufficient documentation

## 2021-12-06 HISTORY — PX: XI ROBOTIC ASSISTED HIATAL HERNIA REPAIR: SHX6889

## 2021-12-06 HISTORY — PX: UPPER GI ENDOSCOPY: SHX6162

## 2021-12-06 LAB — GLUCOSE, CAPILLARY
Glucose-Capillary: 132 mg/dL — ABNORMAL HIGH (ref 70–99)
Glucose-Capillary: 273 mg/dL — ABNORMAL HIGH (ref 70–99)
Glucose-Capillary: 279 mg/dL — ABNORMAL HIGH (ref 70–99)
Glucose-Capillary: 243 mg/dL — ABNORMAL HIGH (ref 70–99)

## 2021-12-06 LAB — TYPE AND SCREEN
ABO/RH(D): O NEG
Antibody Screen: NEGATIVE

## 2021-12-06 SURGERY — REPAIR, HERNIA, HIATAL, ROBOT-ASSISTED
Anesthesia: General | Site: Abdomen

## 2021-12-06 MED ORDER — KETOROLAC TROMETHAMINE 15 MG/ML IJ SOLN
15.0000 mg | Freq: Three times a day (TID) | INTRAMUSCULAR | Status: DC | PRN
  Administered 2021-12-06: 15 mg via INTRAVENOUS
  Filled 2021-12-06: qty 1

## 2021-12-06 MED ORDER — ORAL CARE MOUTH RINSE: 15.0000 mL | Freq: Once | OROMUCOSAL | Status: AC

## 2021-12-06 MED ORDER — LISINOPRIL 20 MG PO TABS
20.0000 mg | ORAL_TABLET | Freq: Every day | ORAL | Status: DC
  Administered 2021-12-07: 20 mg via ORAL
  Filled 2021-12-06: qty 1

## 2021-12-06 MED ORDER — CHLORHEXIDINE GLUCONATE 0.12 % MT SOLN
15.0000 mL | Freq: Once | OROMUCOSAL | Status: AC
  Administered 2021-12-06: 15 mL via OROMUCOSAL

## 2021-12-06 MED ORDER — HYDRALAZINE HCL 20 MG/ML IJ SOLN
INTRAMUSCULAR | Status: AC
  Filled 2021-12-06: qty 1

## 2021-12-06 MED ORDER — SUCCINYLCHOLINE CHLORIDE 200 MG/10ML IV SOSY
PREFILLED_SYRINGE | INTRAVENOUS | Status: DC | PRN
  Administered 2021-12-06: 140 mg via INTRAVENOUS

## 2021-12-06 MED ORDER — FENTANYL CITRATE (PF) 100 MCG/2ML IJ SOLN
INTRAMUSCULAR | Status: AC
  Filled 2021-12-06: qty 2

## 2021-12-06 MED ORDER — OXYCODONE HCL 5 MG PO TABS: 5.0000 mg | ORAL_TABLET | Freq: Once | ORAL | Status: DC | PRN

## 2021-12-06 MED ORDER — LACTATED RINGERS IV SOLN: INTRAVENOUS | Status: DC

## 2021-12-06 MED ORDER — ONDANSETRON 4 MG PO TBDP: 4.0000 mg | ORAL_TABLET | Freq: Four times a day (QID) | ORAL | Status: DC | PRN

## 2021-12-06 MED ORDER — CHLORHEXIDINE GLUCONATE CLOTH 2 % EX PADS: 6.0000 | MEDICATED_PAD | Freq: Once | CUTANEOUS | Status: DC

## 2021-12-06 MED ORDER — LACTATED RINGERS IR SOLN
Status: DC | PRN
  Administered 2021-12-06: 3000 mL

## 2021-12-06 MED ORDER — LIDOCAINE HCL 2 % IJ SOLN
INTRAMUSCULAR | Status: AC
  Filled 2021-12-06: qty 20

## 2021-12-06 MED ORDER — PHENYLEPHRINE HCL-NACL 20-0.9 MG/250ML-% IV SOLN
INTRAVENOUS | Status: DC | PRN
  Administered 2021-12-06: 40 ug/min via INTRAVENOUS

## 2021-12-06 MED ORDER — MIDAZOLAM HCL 2 MG/2ML IJ SOLN
INTRAMUSCULAR | Status: AC
  Filled 2021-12-06: qty 2

## 2021-12-06 MED ORDER — BUPIVACAINE LIPOSOME 1.3 % IJ SUSP: 20.0000 mL | Freq: Once | INTRAMUSCULAR | Status: DC

## 2021-12-06 MED ORDER — METOPROLOL SUCCINATE ER 25 MG PO TB24
25.0000 mg | ORAL_TABLET | Freq: Every day | ORAL | Status: DC
  Administered 2021-12-07: 25 mg via ORAL
  Filled 2021-12-06: qty 1

## 2021-12-06 MED ORDER — ACETAMINOPHEN 10 MG/ML IV SOLN
1000.0000 mg | Freq: Four times a day (QID) | INTRAVENOUS | Status: DC
  Administered 2021-12-06 (×3): 1000 mg via INTRAVENOUS
  Filled 2021-12-06 (×3): qty 100

## 2021-12-06 MED ORDER — EPHEDRINE 5 MG/ML INJ
INTRAVENOUS | Status: AC
  Filled 2021-12-06: qty 5

## 2021-12-06 MED ORDER — PROPOFOL 10 MG/ML IV BOLUS
INTRAVENOUS | Status: AC
  Filled 2021-12-06: qty 20

## 2021-12-06 MED ORDER — HYDRALAZINE HCL 20 MG/ML IJ SOLN: 10.0000 mg | INTRAMUSCULAR | Status: DC | PRN

## 2021-12-06 MED ORDER — ACETAMINOPHEN 500 MG PO TABS
1000.0000 mg | ORAL_TABLET | ORAL | Status: DC
  Filled 2021-12-06: qty 2

## 2021-12-06 MED ORDER — KETOROLAC TROMETHAMINE 30 MG/ML IJ SOLN
INTRAMUSCULAR | Status: AC
  Filled 2021-12-06: qty 1

## 2021-12-06 MED ORDER — KCL IN DEXTROSE-NACL 20-5-0.45 MEQ/L-%-% IV SOLN
INTRAVENOUS | Status: DC
  Filled 2021-12-06: qty 1000

## 2021-12-06 MED ORDER — DIPHENHYDRAMINE HCL 50 MG/ML IJ SOLN: 12.5000 mg | Freq: Four times a day (QID) | INTRAMUSCULAR | Status: DC | PRN

## 2021-12-06 MED ORDER — PANTOPRAZOLE SODIUM 40 MG IV SOLR
40.0000 mg | Freq: Every day | INTRAVENOUS | Status: DC
  Administered 2021-12-06: 40 mg via INTRAVENOUS
  Filled 2021-12-06: qty 10

## 2021-12-06 MED ORDER — DEXAMETHASONE SODIUM PHOSPHATE 10 MG/ML IJ SOLN
INTRAMUSCULAR | Status: AC
  Filled 2021-12-06: qty 1

## 2021-12-06 MED ORDER — ONDANSETRON HCL 4 MG/2ML IJ SOLN
INTRAMUSCULAR | Status: AC
  Filled 2021-12-06: qty 2

## 2021-12-06 MED ORDER — OXYCODONE HCL 5 MG PO TABS
ORAL_TABLET | ORAL | Status: AC
  Filled 2021-12-06: qty 1

## 2021-12-06 MED ORDER — SODIUM CHLORIDE (PF) 0.9 % IJ SOLN
INTRAMUSCULAR | Status: AC
  Filled 2021-12-06: qty 50

## 2021-12-06 MED ORDER — OXYCODONE HCL 5 MG PO TABS
5.0000 mg | ORAL_TABLET | ORAL | Status: DC | PRN
  Administered 2021-12-06: 5 mg via ORAL
  Administered 2021-12-07: 10 mg via ORAL
  Filled 2021-12-06: qty 2
  Filled 2021-12-06: qty 1

## 2021-12-06 MED ORDER — KETAMINE HCL 50 MG/5ML IJ SOSY
PREFILLED_SYRINGE | INTRAMUSCULAR | Status: AC
  Filled 2021-12-06: qty 5

## 2021-12-06 MED ORDER — KETOROLAC TROMETHAMINE 30 MG/ML IJ SOLN
INTRAMUSCULAR | Status: DC | PRN
  Administered 2021-12-06: 15 mg via INTRAVENOUS

## 2021-12-06 MED ORDER — SODIUM CHLORIDE (PF) 0.9 % IJ SOLN
INTRAMUSCULAR | Status: DC | PRN
  Administered 2021-12-06: 50 mL

## 2021-12-06 MED ORDER — HYDRALAZINE HCL 20 MG/ML IJ SOLN
INTRAMUSCULAR | Status: DC | PRN
  Administered 2021-12-06 (×2): 5 mg via INTRAVENOUS

## 2021-12-06 MED ORDER — ONDANSETRON HCL 4 MG/2ML IJ SOLN
INTRAMUSCULAR | Status: DC | PRN
  Administered 2021-12-06: 4 mg via INTRAVENOUS

## 2021-12-06 MED ORDER — ROCURONIUM BROMIDE 10 MG/ML (PF) SYRINGE
PREFILLED_SYRINGE | INTRAVENOUS | Status: DC | PRN
  Administered 2021-12-06: 10 mg via INTRAVENOUS
  Administered 2021-12-06: 50 mg via INTRAVENOUS
  Administered 2021-12-06: 20 mg via INTRAVENOUS

## 2021-12-06 MED ORDER — FENTANYL CITRATE (PF) 250 MCG/5ML IJ SOLN
INTRAMUSCULAR | Status: DC | PRN
  Administered 2021-12-06 (×2): 50 ug via INTRAVENOUS
  Administered 2021-12-06: 25 ug via INTRAVENOUS

## 2021-12-06 MED ORDER — KCL IN DEXTROSE-NACL 20-5-0.45 MEQ/L-%-% IV SOLN
INTRAVENOUS | Status: AC
  Filled 2021-12-06: qty 1000

## 2021-12-06 MED ORDER — 0.9 % SODIUM CHLORIDE (POUR BTL) OPTIME
TOPICAL | Status: DC | PRN
  Administered 2021-12-06: 1000 mL

## 2021-12-06 MED ORDER — BUPIVACAINE LIPOSOME 1.3 % IJ SUSP
INTRAMUSCULAR | Status: DC | PRN
  Administered 2021-12-06: 20 mL

## 2021-12-06 MED ORDER — HEPARIN SODIUM (PORCINE) 5000 UNIT/ML IJ SOLN
5000.0000 [IU] | Freq: Once | INTRAMUSCULAR | Status: DC
Start: 2021-12-06 — End: 2021-12-06
  Filled 2021-12-06: qty 1

## 2021-12-06 MED ORDER — MIDAZOLAM HCL 2 MG/2ML IJ SOLN
INTRAMUSCULAR | Status: DC | PRN
  Administered 2021-12-06 (×2): 1 mg via INTRAVENOUS

## 2021-12-06 MED ORDER — DEXAMETHASONE SODIUM PHOSPHATE 10 MG/ML IJ SOLN
INTRAMUSCULAR | Status: DC | PRN
  Administered 2021-12-06: 4 mg via INTRAVENOUS

## 2021-12-06 MED ORDER — KETAMINE HCL 10 MG/ML IJ SOLN
INTRAMUSCULAR | Status: DC | PRN
  Administered 2021-12-06: 25 mg via INTRAVENOUS

## 2021-12-06 MED ORDER — PROPOFOL 10 MG/ML IV BOLUS
INTRAVENOUS | Status: DC | PRN
  Administered 2021-12-06: 150 mg via INTRAVENOUS

## 2021-12-06 MED ORDER — SUCCINYLCHOLINE CHLORIDE 200 MG/10ML IV SOSY
PREFILLED_SYRINGE | INTRAVENOUS | Status: AC
  Filled 2021-12-06: qty 10

## 2021-12-06 MED ORDER — MORPHINE SULFATE (PF) 2 MG/ML IV SOLN: 1.0000 mg | INTRAVENOUS | Status: DC | PRN

## 2021-12-06 MED ORDER — ENOXAPARIN SODIUM 40 MG/0.4ML IJ SOSY
40.0000 mg | PREFILLED_SYRINGE | INTRAMUSCULAR | Status: DC
  Administered 2021-12-07: 40 mg via SUBCUTANEOUS
  Filled 2021-12-06: qty 0.4

## 2021-12-06 MED ORDER — ONDANSETRON HCL 4 MG/2ML IJ SOLN: 4.0000 mg | Freq: Four times a day (QID) | INTRAMUSCULAR | Status: DC | PRN

## 2021-12-06 MED ORDER — BUPIVACAINE LIPOSOME 1.3 % IJ SUSP
INTRAMUSCULAR | Status: AC
  Filled 2021-12-06: qty 20

## 2021-12-06 MED ORDER — OXYCODONE HCL 5 MG/5ML PO SOLN: 5.0000 mg | Freq: Once | ORAL | Status: DC | PRN

## 2021-12-06 MED ORDER — ROCURONIUM BROMIDE 10 MG/ML (PF) SYRINGE
PREFILLED_SYRINGE | INTRAVENOUS | Status: AC
  Filled 2021-12-06: qty 10

## 2021-12-06 MED ORDER — EPHEDRINE SULFATE-NACL 50-0.9 MG/10ML-% IV SOSY
PREFILLED_SYRINGE | INTRAVENOUS | Status: DC | PRN
  Administered 2021-12-06: 5 mg via INTRAVENOUS
  Administered 2021-12-06 (×2): 10 mg via INTRAVENOUS
  Administered 2021-12-06: 5 mg via INTRAVENOUS

## 2021-12-06 MED ORDER — PROCHLORPERAZINE EDISYLATE 10 MG/2ML IJ SOLN
10.0000 mg | Freq: Four times a day (QID) | INTRAMUSCULAR | Status: DC | PRN
  Administered 2021-12-06: 10 mg via INTRAVENOUS
  Filled 2021-12-06: qty 2

## 2021-12-06 MED ORDER — KETOROLAC TROMETHAMINE 30 MG/ML IJ SOLN: 30.0000 mg | Freq: Once | INTRAMUSCULAR | Status: DC | PRN

## 2021-12-06 MED ORDER — HYDROMORPHONE HCL 1 MG/ML IJ SOLN: 0.2500 mg | INTRAMUSCULAR | Status: DC | PRN

## 2021-12-06 MED ORDER — SUGAMMADEX SODIUM 200 MG/2ML IV SOLN
INTRAVENOUS | Status: DC | PRN
  Administered 2021-12-06: 150 mg via INTRAVENOUS

## 2021-12-06 MED ORDER — ACETAMINOPHEN 10 MG/ML IV SOLN
INTRAVENOUS | Status: AC
  Administered 2021-12-06: 1000 mg
  Filled 2021-12-06: qty 100

## 2021-12-06 MED ORDER — INSULIN ASPART 100 UNIT/ML IJ SOLN
8.0000 [IU] | Freq: Once | INTRAMUSCULAR | Status: AC
  Administered 2021-12-06: 8 [IU] via SUBCUTANEOUS

## 2021-12-06 MED ORDER — INSULIN ASPART 100 UNIT/ML IJ SOLN
INTRAMUSCULAR | Status: AC
  Filled 2021-12-06: qty 1

## 2021-12-06 MED ORDER — LACTATED RINGERS IV SOLN: INTRAVENOUS | Status: DC | PRN

## 2021-12-06 MED ORDER — DIPHENHYDRAMINE HCL 12.5 MG/5ML PO ELIX: 12.5000 mg | ORAL_SOLUTION | Freq: Four times a day (QID) | ORAL | Status: DC | PRN

## 2021-12-06 MED ORDER — LIDOCAINE 2% (20 MG/ML) 5 ML SYRINGE
INTRAMUSCULAR | Status: DC | PRN
  Administered 2021-12-06: 60 mg via INTRAVENOUS

## 2021-12-06 MED ORDER — CEFAZOLIN SODIUM-DEXTROSE 2-4 GM/100ML-% IV SOLN
2.0000 g | INTRAVENOUS | Status: AC
  Administered 2021-12-06: 2 g via INTRAVENOUS
  Filled 2021-12-06: qty 100

## 2021-12-06 MED ORDER — LIDOCAINE 20MG/ML (2%) 15 ML SYRINGE OPTIME
INTRAMUSCULAR | Status: DC | PRN
  Administered 2021-12-06: 1.5 mg/kg/h via INTRAVENOUS

## 2021-12-06 MED ORDER — ONDANSETRON HCL 4 MG/2ML IJ SOLN
4.0000 mg | Freq: Once | INTRAMUSCULAR | Status: AC | PRN
  Administered 2021-12-06: 4 mg via INTRAVENOUS

## 2021-12-06 MED ORDER — SIMETHICONE 80 MG PO CHEW: 40.0000 mg | CHEWABLE_TABLET | Freq: Four times a day (QID) | ORAL | Status: DC | PRN

## 2021-12-06 SURGICAL SUPPLY — 67 items
APPLIER CLIP 5 13 M/L LIGAMAX5 (MISCELLANEOUS)
APPLIER CLIP ROT 10 11.4 M/L (STAPLE)
BLADE SURG SZ11 CARB STEEL (BLADE) ×3 IMPLANT
CHLORAPREP W/TINT 26 (MISCELLANEOUS) ×3 IMPLANT
CLIP APPLIE 5 13 M/L LIGAMAX5 (MISCELLANEOUS) IMPLANT
CLIP APPLIE ROT 10 11.4 M/L (STAPLE) IMPLANT
CLSR STERI-STRIP ANTIMIC 1/2X4 (GAUZE/BANDAGES/DRESSINGS) ×3 IMPLANT
COVER SURGICAL LIGHT HANDLE (MISCELLANEOUS) ×3 IMPLANT
COVER TIP SHEARS 8 DVNC (MISCELLANEOUS) IMPLANT
COVER TIP SHEARS 8MM DA VINCI (MISCELLANEOUS)
DERMABOND ADVANCED (GAUZE/BANDAGES/DRESSINGS)
DERMABOND ADVANCED .7 DNX12 (GAUZE/BANDAGES/DRESSINGS) IMPLANT
DRAIN PENROSE 0.5X18 (DRAIN) ×1 IMPLANT
DRAPE ARM DVNC X/XI (DISPOSABLE) ×8 IMPLANT
DRAPE COLUMN DVNC XI (DISPOSABLE) ×2 IMPLANT
DRAPE DA VINCI XI ARM (DISPOSABLE) ×4
DRAPE DA VINCI XI COLUMN (DISPOSABLE) ×1
DRSG TEGADERM 2-3/8X2-3/4 SM (GAUZE/BANDAGES/DRESSINGS) ×18 IMPLANT
ELECT REM PT RETURN 15FT ADLT (MISCELLANEOUS) ×3 IMPLANT
GAUZE 4X4 16PLY ~~LOC~~+RFID DBL (SPONGE) ×3 IMPLANT
GAUZE SPONGE 2X2 8PLY STRL LF (GAUZE/BANDAGES/DRESSINGS) ×2 IMPLANT
GLOVE BIO SURGEON STRL SZ 6 (GLOVE) ×9 IMPLANT
GLOVE BIOGEL PI IND STRL 8 (GLOVE) ×2 IMPLANT
GLOVE BIOGEL PI INDICATOR 8 (GLOVE) ×1
GLOVE INDICATOR 6.5 STRL GRN (GLOVE) ×9 IMPLANT
GLOVE SURG LX 8.0 MICRO (GLOVE) ×3
GLOVE SURG LX STRL 8.0 MICRO (GLOVE) ×6 IMPLANT
GOWN STRL REUS W/ TWL XL LVL3 (GOWN DISPOSABLE) IMPLANT
GOWN STRL REUS W/TWL XL LVL3 (GOWN DISPOSABLE)
GRASPER SUT TROCAR 14GX15 (MISCELLANEOUS) IMPLANT
IRRIG SUCT STRYKERFLOW 2 WTIP (MISCELLANEOUS) ×3
IRRIGATION SUCT STRKRFLW 2 WTP (MISCELLANEOUS) ×2 IMPLANT
KIT BASIN OR (CUSTOM PROCEDURE TRAY) ×3 IMPLANT
KIT TURNOVER KIT A (KITS) ×1 IMPLANT
LUBRICANT JELLY K Y 4OZ (MISCELLANEOUS) IMPLANT
MARKER SKIN DUAL TIP RULER LAB (MISCELLANEOUS) ×1 IMPLANT
NEEDLE HYPO 22GX1.5 SAFETY (NEEDLE) ×3 IMPLANT
PACK CARDIOVASCULAR III (CUSTOM PROCEDURE TRAY) ×3 IMPLANT
PAD POSITIONING PINK XL (MISCELLANEOUS) ×3 IMPLANT
SCISSORS LAP 5X35 DISP (ENDOMECHANICALS) IMPLANT
SEAL CANN UNIV 5-8 DVNC XI (MISCELLANEOUS) ×8 IMPLANT
SEAL XI 5MM-8MM UNIVERSAL (MISCELLANEOUS) ×4
SEALER VESSEL DA VINCI XI (MISCELLANEOUS) ×1
SEALER VESSEL EXT DVNC XI (MISCELLANEOUS) ×2 IMPLANT
SOL ANTI FOG 6CC (MISCELLANEOUS) ×2 IMPLANT
SOLUTION ANTI FOG 6CC (MISCELLANEOUS) ×1
SOLUTION ELECTROLUBE (MISCELLANEOUS) ×3 IMPLANT
SPIKE FLUID TRANSFER (MISCELLANEOUS) ×2 IMPLANT
SPONGE GAUZE 2X2 STER 10/PKG (GAUZE/BANDAGES/DRESSINGS) ×1
SUT ETHIBOND 0 36 GRN (SUTURE) ×9 IMPLANT
SUT MNCRL AB 4-0 PS2 18 (SUTURE) ×3 IMPLANT
SUT SILK 0 SH 30 (SUTURE) IMPLANT
SUT SILK 2 0 SH (SUTURE) IMPLANT
SUT VIC AB 0 UR5 27 (SUTURE) IMPLANT
SYR 10ML ECCENTRIC (SYRINGE) ×3 IMPLANT
SYR 20ML LL LF (SYRINGE) ×3 IMPLANT
TIP INNERVISION DETACH 40FR (MISCELLANEOUS) IMPLANT
TIP INNERVISION DETACH 50FR (MISCELLANEOUS) IMPLANT
TIP INNERVISION DETACH 56FR (MISCELLANEOUS) IMPLANT
TIPS INNERVISION DETACH 40FR (MISCELLANEOUS)
TOWEL OR 17X26 10 PK STRL BLUE (TOWEL DISPOSABLE) ×3 IMPLANT
TRAY FOLEY MTR SLVR 16FR STAT (SET/KITS/TRAYS/PACK) IMPLANT
TROCAR ADV FIXATION 12X100MM (TROCAR) IMPLANT
TROCAR ADV FIXATION 5X100MM (TROCAR) IMPLANT
TROCAR BLADELESS OPT 5 100 (ENDOMECHANICALS) ×1 IMPLANT
TROCAR Z-THREAD OPTICAL 5X100M (TROCAR) ×2 IMPLANT
TUBING INSUFFLATION 10FT LAP (TUBING) ×3 IMPLANT

## 2021-12-06 NOTE — H&P (Signed)
CC: here for hernia repair  Requesting provider: n/a  HPI: Beth Garcia is an 71 y.o. female who is here for robotic repair of large hiatal hernia with partial wrap and poss mesh and upper endoscopy. No changes since seen in clinic in march  Old hpi  Beth Garcia is a 71 y.o. female who is seen today for hiatal hernia with Beth Garcia erosions. We had a very long discussion about robotic repair of hiatal hernia repair with partial fundoplication along with risk and benefits and lifestyle changes. Initially met her about a month ago. She was going to go see the cardiologist to get restratification and get additional imaging and come back to discuss the results..  He saw her cardiologist on March 7 at [facility]. I reviewed their note. He felt that her symptoms were more due to chronic anemia and hiatal hernia itself as opposed to anginal equivalent. He felt that she was still moderately active and able to perform at least 4 METS with normal LV systolic function without heart failure or known arrhythmia. Her MACE risk for the proposed procedure is considered moderate, and she is optimized from the cardiac perspective (she is currently not an ideal candidate for coronary intervention).  - Recommend to continue Toprol XL 25 mg daily / perioperatively without interruption to minimize risk for rebound / MI; Pre and post procedural ECG is recommended - ASA has already been discontinued due to recurrence of blood loss anemia - She is tolerating combination of Crestor and Zetia well Biggest complaint is shortness of breath due to her large hiatal hernia. Otherwise see my initial office note regarding her other symptoms. Denies any medical changes since she was last seen. She states that she has had repeat labs to check her anemia with her PCP.  Past Medical History:  Diagnosis Date   Anemia    recent iron transfusions in 2023   Arthritis    Hands   Diabetes mellitus, type II ([place])    diet and exercise  controlled   Dyspnea    Elevated temperature    Notmal temp is 99-100   GERD (gastroesophageal reflux disease)    Hypercalcemia    Hyperlipidemia    Hypertension    Vitamin D deficiency     Past Surgical History:  Procedure Laterality Date   ABDOMINAL HYSTERECTOMY     bilateral breast biopsy     BIOPSY  08/19/2020   Procedure: BIOPSY;  Surgeon: Harvel Quale, MD;  [facility];  Service: Gastroenterology;;  duodenum   COLONOSCOPY W/ POLYPECTOMY     COLONOSCOPY WITH PROPOFOL N/A 08/19/2020   Procedure: COLONOSCOPY WITH PROPOFOL;  Surgeon: Harvel Quale, MD;  Location[facility];  Service: Gastroenterology;  Laterality: N/A;  7:30   ESOPHAGOGASTRODUODENOSCOPY (EGD) WITH PROPOFOL N/A 08/19/2020   Procedure: ESOPHAGOGASTRODUODENOSCOPY (EGD) WITH PROPOFOL;  Surgeon: Harvel Quale, MD;  Location[facility];  Service: Gastroenterology;  Laterality: N/A;   GIVENS CAPSULE STUDY N/A 08/11/2021   Procedure: GIVENS CAPSULE STUDY;  Surgeon: Harvel Quale, MD;  Location: [facility];  Service: Gastroenterology;  Laterality: N/A;  7:30   PARATHYROIDECTOMY Right 02/01/2018   Procedure: RIGHT PARATHYROIDECTOMY;  Surgeon: Armandina Gemma, MD;  Location: [facility];  Service: General;  Laterality: Right;   TONSILLECTOMY      Family History  Problem Relation Age of Onset   Hypertension Mother    Hyperlipidemia Mother    Hyperlipidemia Father    Stroke Father  Cancer Father    Hypertension Sister    Hyperlipidemia Sister    Hypertension Brother    Hyperlipidemia Brother     Social:  reports that she has never smoked. She has never used smokeless tobacco. She reports that she does not drink alcohol and does not use drugs.  Allergies:  Allergies  Allergen Reactions   Lamisil [Terbinafine] Rash    Medications: I have reviewed the patient's current medications.   ROS - all of the below systems have been reviewed with the patient and  positives are indicated with bold text General: chills, fever or night sweats Eyes: blurry vision or double vision ENT: epistaxis or sore throat Allergy/Immunology: itchy/watery eyes or nasal congestion Hematologic/Lymphatic: bleeding problems, blood clots or swollen lymph nodes Endocrine: temperature intolerance or unexpected weight changes Breast: new or changing breast lumps or nipple discharge Resp: cough, shortness of breath, or wheezing CV: chest pain or dyspnea on exertion GI: as per HPI GU: dysuria, trouble voiding, or hematuria MSK: joint pain or joint stiffness Neuro: TIA or stroke symptoms Derm: pruritus and skin lesion changes Psych: anxiety and depression  PE Blood pressure (!) 179/85, pulse 65, temperature 99.1 F (37.3 C), temperature source Oral, resp. rate 16, SpO2 95 %. Constitutional: NAD; conversant; no deformities Eyes: Moist conjunctiva; no lid lag; anicteric; PERRL Neck: Trachea midline; no thyromegaly Lungs: Normal respiratory effort; no tactile fremitus CV: RRR; no palpable thrills; no pitting edema GI: Abd soft, nt, nd; no palpable hepatosplenomegaly MSK: Normal gait; no clubbing/cyanosis Psychiatric: Appropriate affect; alert and oriented x3 Lymphatic: No palpable cervical or axillary lymphadenopathy Skin:no rash/lesions  Results for orders placed or performed during the hospital encounter of 12/06/21 (from the past 48 hour(s))  Glucose, capillary     Status: Abnormal   Collection Time: 12/06/21  5:52 AM  Result Value Ref Range   Glucose-Capillary 132 (H) 70 - 99 mg/dL    Comment: Glucose reference range applies only to samples taken after fasting for at least 8 hours.   Comment 1 Notify RN     No results found.  Imaging: reviewed  A/P: Beth Garcia is an 71 y.o. female with  Coronary artery disease of native artery of native heart with stable angina pectoris (CMS-HCC)  Diabetes mellitus without complication (CMS-HCC)  Beth Garcia lesion,  chronic  Hiatal hernia  H/o Iron deficiency anemia due to chronic blood loss  To OR for repair of large HH with partial wrap and poss mesh, upper endo ERAS Subcu heparin All ? Asked and answered  Leighton Ruff. Redmond Pulling, MD, FACS General, Bariatric, & Minimally Invasive Surgery [facility], [place]

## 2021-12-06 NOTE — Anesthesia Preprocedure Evaluation (Signed)
Anesthesia Evaluation  Patient identified by MRN, date of birth, ID band Patient awake    Reviewed: Allergy & Precautions, NPO status , Patient's Chart, lab work & pertinent test results  Airway Mallampati: II  TM Distance: >3 FB Neck ROM: Full    Dental no notable dental hx.    Pulmonary neg pulmonary ROS,    Pulmonary exam normal breath sounds clear to auscultation       Cardiovascular hypertension, negative cardio ROS Normal cardiovascular exam Rhythm:Regular Rate:Normal     Neuro/Psych negative neurological ROS  negative psych ROS   GI/Hepatic Neg liver ROS, hiatal hernia, GERD  ,  Endo/Other  diabetes, Type 2  Renal/GU negative Renal ROS  negative genitourinary   Musculoskeletal negative musculoskeletal ROS (+)   Abdominal   Peds negative pediatric ROS (+)  Hematology negative hematology ROS (+)   Anesthesia Other Findings   Reproductive/Obstetrics negative OB ROS                             Anesthesia Physical Anesthesia Plan  ASA: 2  Anesthesia Plan: General   Post-op Pain Management:    Induction: Intravenous and Rapid sequence  PONV Risk Score and Plan: 3 and Ondansetron, Dexamethasone, Midazolam and Treatment may vary due to age or medical condition  Airway Management Planned: Oral ETT  Additional Equipment:   Intra-op Plan:   Post-operative Plan: Extubation in OR  Informed Consent: I have reviewed the patients History and Physical, chart, labs and discussed the procedure including the risks, benefits and alternatives for the proposed anesthesia with the patient or authorized representative who has indicated his/her understanding and acceptance.     Dental advisory given  Plan Discussed with: CRNA and Surgeon  Anesthesia Plan Comments:         Anesthesia Quick Evaluation

## 2021-12-06 NOTE — Anesthesia Postprocedure Evaluation (Signed)
Anesthesia Post Note  Patient: Beth Garcia  Procedure(s) Performed: XI ROBOTIC ASSISTED LARGE HIATAL HERNIA REPAIR WITH PARTIAL FUNDOPLICATION (Abdomen) UPPER ENDOSCOPY     Patient location during evaluation: PACU Anesthesia Type: General Level of consciousness: awake and alert Pain management: pain level controlled Vital Signs Assessment: post-procedure vital signs reviewed and stable Respiratory status: spontaneous breathing, nonlabored ventilation, respiratory function stable and patient connected to nasal cannula oxygen Cardiovascular status: blood pressure returned to baseline and stable Postop Assessment: no apparent nausea or vomiting Anesthetic complications: no   No notable events documented.  Last Vitals:  Vitals:   12/06/21 0554 12/06/21 1025  BP: (!) 179/85 (!) 149/79  Pulse: 65 (!) 101  Resp: 16 12  Temp: 37.3 C   SpO2: 95% 98%    Last Pain:  Vitals:   12/06/21 0554  TempSrc: Oral                 Harsh Trulock S

## 2021-12-06 NOTE — Transfer of Care (Signed)
Immediate Anesthesia Transfer of Care Note  Patient: Beth Garcia  Procedure(s) Performed: XI ROBOTIC ASSISTED LARGE HIATAL HERNIA REPAIR WITH PARTIAL FUNDOPLICATION (Abdomen) UPPER ENDOSCOPY  Patient Location: PACU  Anesthesia Type:General  Level of Consciousness: awake, drowsy and patient cooperative  Airway & Oxygen Therapy: Patient Spontanous Breathing and Patient connected to face mask oxygen  Post-op Assessment: Report given to RN and Post -op Vital signs reviewed and stable  Post vital signs: Reviewed and stable  Last Vitals:  Vitals Value Taken Time  BP    Temp    Pulse    Resp    SpO2      Last Pain:  Vitals:   12/06/21 0554  TempSrc: Oral         Complications: No notable events documented.

## 2021-12-06 NOTE — Anesthesia Procedure Notes (Signed)
Procedure Name: Intubation Date/Time: 12/06/2021 7:32 AM Performed by: Eben Burow, CRNA Pre-anesthesia Checklist: Patient identified, Emergency Drugs available, Suction available, Patient being monitored and Timeout performed Patient Re-evaluated:Patient Re-evaluated prior to induction Oxygen Delivery Method: Circle system utilized Preoxygenation: Pre-oxygenation with 100% oxygen Induction Type: IV induction and Rapid sequence Laryngoscope Size: Mac and 4 Grade View: Grade I Tube type: Oral Tube size: 7.0 mm Number of attempts: 1 Airway Equipment and Method: Stylet Placement Confirmation: ETT inserted through vocal cords under direct vision, positive ETCO2 and breath sounds checked- equal and bilateral Secured at: 21 cm Tube secured with: Tape Dental Injury: Teeth and Oropharynx as per pre-operative assessment

## 2021-12-06 NOTE — Op Note (Signed)
12/06/2021  10:25 AM  PATIENT:  Beth Garcia  71 y.o. female  PRE-OPERATIVE DIAGNOSIS: large hiatal hernia  POST-OPERATIVE DIAGNOSIS:  large type III hiatal hernia  PROCEDURE:  Procedure(s): XI ROBOTIC ASSISTED LARGE TYPE III HIATAL HERNIA REPAIR WITH PARTIAL FUNDOPLICATION UPPER ENDOSCOPY LAPAROSCOPIC BILATERAL TAP BLOCK SURGEON:  Surgeon(s): [name], MD   ASSISTANTS: Kinsinger, Arta Bruce, MD   ANESTHESIA:   general  DRAINS: none   LOCAL MEDICATIONS USED:  OTHER EXPAREL  SPECIMEN:  Source of Specimen:  HERNIA SAC  DISPOSITION OF SPECIMEN:   DISCARDED  COUNTS:  YES  INDICATION FOR PROCEDURE: She was referred by GI for consideration for repair of her large hiatal hernia.  She had had intermittent history of iron deficiency anemia due to [facility] erosions seen on endoscopy.  She also had sensation of food getting stuck on a daily basis mostly with solids but not with liquids.  She denied any heartburn.  No cough or choking.  No regurgitation.  She would get shortness of breath if she bent forward.  She has required IV iron infusion.  We discussed hiatal hernia repair with partial fundoplication with possible mesh and upper endoscopy.  Please see outside records for additional information  PROCEDURE: Patient was given Tylenol preoperatively along with 5000 units of subcutaneous heparin.  She was taken to the OR to at [facility] placed upon on the operating room table.  General endotracheal anesthesia was established.  Sequential compression devices were placed.  Her abdomen was prepped and draped in the usual standard surgical fashion with ChloraPrep.  She received IV antibiotic prior to skin incision.  A surgical timeout was performed.  Access to the abdomen was achieved using the Optiview technique.  A small incision was made about 8 cm lateral to the umbilicus in the left midabdomen.  Then using a 0 degree 5 mm laparoscope through a 5 mm trocar I advanced it through  all layers of the abdominal wall and carefully entered the abdominal cavity.  Pneumoperitoneum was smoothly established up to a patient pressure of 15 mmHg without any change in patient vital signs.  The abdominal cavity was surveilled and there is no evidence of injury to surrounding structures.  Patient was placed in reverse Trendelenburg.  8 mm robotic trocars were placed at the level of the umbilicus, 8 cm to the right of the umbilicus.  The optical entry trocar was exchanged with an 8 mm robotic trocar and a final 8 mm robotic trocars placed in the left lateral abdominal wall.  We then performed a bilateral laparoscopic tap block for postoperative pain relief.  A small incision was made in the subxiphoid region to accommodate a 5 mm trocar.  This was exchanged with a Nathanson liver retractor to lift up the left lobe of the liver which was somewhat large.  But we were able to lift up the left lobe and have a good view of the hiatus.  Patient had a large hiatal hernia.  The defect of the diaphragm was approximately 7 cm.  The GE junction and and fundus were above the diaphragm.  The robot was docked to the trocars.  A fenestrated bipolar was placed in arm 1, camera in arm 2, vessel sealer in arm 3 and a tip up grasper in arm 4.  I placed a 5 mm trocar in the right lateral abdominal wall for my assistant.  I then went to the surgeon console.  My assistant provided traction and held reduced some  of the stomach.  I incised the peritoneum along the anterior diaphragm and got into the avascular plane.  I carried this over to the right crus of the diaphragm with the vessel sealer.  We are able to get into the hernia sac.  My assistant pulled down on the hernia sac and I also did to with the tip up grasper.  Then using gentle blunt dissection I sweep the hernia sac away from the mediastinal contents.  We then came back across anteriorly up over to the left side and came down the left crus of the diaphragm.  I then  was able to come posteriorly and identify the confluence of the left and right crura.  I took down some proximal short gastric vessels around the fundus using the vessel sealer with my assistant retracting the stomach.  We then inserted a Penrose drain and wrapped it around the upper stomach and my assistant held on this why continued to do a circumferential mobilization within the mediastinum.  Identified the aorta and the esophagus.  I lifted the esophagus up off the aorta by taking down the thin avascular tissue with the vessel sealer.  I was ultimately able to identify the posterior vagus nerve.  Continued mobilization circumferentially in the mediastinum.  At this point we had reduced all the stomach out of the mediastinum.  And I had felt that we were fairly high up in the mediastinum so I got the Olympus endoscope placed in the oropharynx and guided it down the esophagus.  There is no evidence of injury to the esophagus.  We identified the Z-line.  It was still fortunately about 1 cm above the diaphragm.  The stomach was desufflated and the endoscope was removed.  I returned to the surgeon console.  I went back into the mediastinum and continue to mobilize more proximally.  This was continued in a circumferential manner.  There was a little bit of violation of the left parietal pleura.  The posterior vagus nerve was identified.  I never identified or saw the anterior vagus nerve.  Continued mobilizing the esophagus in a circumferential manner taking down the avascular tissue.  I change the robotic camera angle and continued more proximal mobilization.  I identified the azygos vein and at that point I stopped.  I then reobtained the Olympus endoscope and guided it down the patient's oropharynx into her esophagus again no evidence of injury to the esophagus.  We were able to transilluminate and the Z-line was now within the abdominal cavity.  There was approximately about 2 cm of esophagus within the abdominal  cavity.  The stomach was desufflated and the endoscope was removed.  I returned to the surgeon console.  At this point I felt we had achieved enough mobilization.  We reduced pneumoperitoneum.  Her left and right diaphragm muscles were quite healthy appearing they were not thinned out or attenuated.  It appeared they would come together without tension.  The diaphragm was reapproximated with 4 interrupted 0 Ethibond sutures.  This left a small gap around the esophagus.  We then went about debulking the distal esophagus and proximal stomach of the hernia sac.  This was done by mobilizing the hernia sac off the stomach and distal esophagus ensuring that there is no injury to either 1 using the vessel sealer.  I then did a partial 180 degree wrap.  The fundus was brought up from the left upper abdomen behind the esophagus to the right side.  I then  was able to perform a shoeshine maneuver.  3 interrupted sutures of 2-0 Ethibond were placed from the esophagus into the stomach on the right side of the abdomen.  In a similar fashion 3 sutures were placed on the left side between the stomach and the esophagus.  This resulted in the esophagus looking like "hot dog sitting in a bun".  I then scoped the patient 1 last time.  Again no injury to the esophagus.  Advance the endoscope into the stomach and inflated the stomach and then retroflexed and visualize the hiatus.  There is no evidence of a hiatal hernia.  There was evidence of a 180 degree wrap.  The stomach was desufflated and the endoscope was removed.  My assistant had removed all the needles from the abdomen.  We removed the hernia sac in pieces as well.  Scrubbed back in.  We removed the liver retractor.  There is no evidence of injury to the liver.  Additional local was infiltrated in the subxiphoid space.  Pneumoperitoneum was released and the robotic trocars were removed.  Skin incisions were closed with a 4-0 Monocryl in a subcuticular fashion followed by the  application of Dermabond.  All needle, instrument, and sponge counts were correct x2.  There were no immediate complications.  The patient tolerated the procedure well.  PLAN OF CARE: Admit for overnight observation  PATIENT DISPOSITION:  PACU - hemodynamically stable.   Delay start of Pharmacological VTE agent (>24hrs) due to surgical blood loss or risk of bleeding:  no  Leighton Ruff. Redmond Pulling, MD, FACS General, Bariatric, & Minimally Invasive Surgery [facility], [place]

## 2021-12-06 NOTE — Addendum Note (Signed)
Addendum  created 12/06/21 1126 by Eben Burow, CRNA   Charge Capture section accepted, Visit diagnoses modified

## 2021-12-07 ENCOUNTER — Encounter (HOSPITAL_COMMUNITY): Payer: Self-pay | Admitting: General Surgery

## 2021-12-07 ENCOUNTER — Observation Stay (HOSPITAL_COMMUNITY): Payer: Medicare Other

## 2021-12-07 DIAGNOSIS — I10 Essential (primary) hypertension: Secondary | ICD-10-CM | POA: Diagnosis not present

## 2021-12-07 DIAGNOSIS — E119 Type 2 diabetes mellitus without complications: Secondary | ICD-10-CM | POA: Diagnosis not present

## 2021-12-07 DIAGNOSIS — K229 Disease of esophagus, unspecified: Secondary | ICD-10-CM | POA: Diagnosis not present

## 2021-12-07 DIAGNOSIS — K449 Diaphragmatic hernia without obstruction or gangrene: Secondary | ICD-10-CM | POA: Diagnosis not present

## 2021-12-07 DIAGNOSIS — I25118 Atherosclerotic heart disease of native coronary artery with other forms of angina pectoris: Secondary | ICD-10-CM | POA: Diagnosis not present

## 2021-12-07 DIAGNOSIS — K254 Chronic or unspecified gastric ulcer with hemorrhage: Secondary | ICD-10-CM | POA: Diagnosis not present

## 2021-12-07 LAB — BASIC METABOLIC PANEL
Anion gap: 8 (ref 5–15)
BUN: 14 mg/dL (ref 8–23)
CO2: 22 mmol/L (ref 22–32)
Calcium: 8.8 mg/dL — ABNORMAL LOW (ref 8.9–10.3)
Chloride: 105 mmol/L (ref 98–111)
Creatinine, Ser: 0.8 mg/dL (ref 0.44–1.00)
GFR, Estimated: >60 mL/min (ref >60)
Glucose, Bld: 114 mg/dL — ABNORMAL HIGH (ref 70–99)
Potassium: 3.7 mmol/L (ref 3.5–5.1)
Sodium: 135 mmol/L (ref 135–145)

## 2021-12-07 LAB — CBC
HCT: 37.1 % (ref 36.0–46.0)
Hemoglobin: 12 g/dL (ref 12.0–15.0)
MCH: 29.4 pg (ref 26.0–34.0)
MCHC: 32.3 g/dL (ref 30.0–36.0)
MCV: 90.9 fL (ref 80.0–100.0)
Platelets: 202 10*3/uL (ref 150–400)
RBC: 4.08 MIL/uL (ref 3.87–5.11)
RDW: 12.5 % (ref 11.5–15.5)
WBC: 12.7 10*3/uL — ABNORMAL HIGH (ref 4.0–10.5)
nRBC: 0 % (ref 0.0–0.2)

## 2021-12-07 MED ORDER — TRAMADOL HCL 50 MG PO TABS: 50.0000 mg | ORAL_TABLET | Freq: Four times a day (QID) | ORAL | 0 refills | Status: DC | PRN

## 2021-12-07 MED ORDER — SIMETHICONE 80 MG PO CHEW: 80.0000 mg | CHEWABLE_TABLET | Freq: Four times a day (QID) | ORAL | Status: AC | PRN

## 2021-12-07 MED ORDER — ACETAMINOPHEN 500 MG PO TABS: 1000.0000 mg | ORAL_TABLET | Freq: Three times a day (TID) | ORAL | 0 refills | Status: AC

## 2021-12-07 MED ORDER — BOOST / RESOURCE BREEZE PO LIQD CUSTOM: 1.0000 | Freq: Three times a day (TID) | ORAL | Status: DC

## 2021-12-07 MED ORDER — ACETAMINOPHEN 500 MG PO TABS
1000.0000 mg | ORAL_TABLET | Freq: Once | ORAL | Status: AC
Start: 2021-12-07 — End: 2021-12-07
  Administered 2021-12-07: 1000 mg via ORAL
  Filled 2021-12-07: qty 2

## 2021-12-07 NOTE — Progress Notes (Signed)
Transition of Care [facility]) Screening Note  Patient Details  Name: Beth Garcia Date of Birth: Mar 25, 1951  Transition of Care [facility]) CM/SW Contact:    Sherie Don, LCSW Phone Number: 12/07/2021, 12:47 PM  Transition of Care Department [facility]) has reviewed patient and no TOC needs have been identified at this time. We will continue to monitor patient advancement through interdisciplinary progression rounds. If new patient transition needs arise, please place a TOC consult.

## 2021-12-07 NOTE — Discharge Summary (Signed)
Physician Discharge Summary  Beth Garcia IRJ:[id number] DOB: 1951/05/19 DOA: 12/06/2021  PCP: Caryl Bis, MD  Admit date: 12/06/2021 Discharge date: 12/07/2021  Recommendations for Outpatient Follow-up:     Follow-up Information     Beth Pickerel, MD. Schedule an appointment as soon as possible for a visit in 3 week(s).   Specialty: General Surgery Why: For wound re-check Contact information: [place] [address] [phone]                Discharge Diagnoses:  large type III hiatal hernia Coronary artery disease of native artery of native heart with stable angina pectoris (CMS-HCC)  Diabetes mellitus without complication (CMS-HCC)  Lysbeth Garcia lesion, chronic  H/o Iron deficiency anemia due to chronic blood loss  Surgical Procedure: XI ROBOTIC ASSISTED LARGE TYPE III HIATAL HERNIA REPAIR WITH PARTIAL FUNDOPLICATION UPPER ENDOSCOPY LAPAROSCOPIC BILATERAL TAP BLOCK  Discharge Condition: good Disposition: home  Diet recommendation: full liquid diet  Filed Weights   12/06/21 1210  Weight: 76.4 kg    History of present illness:  She came in for planned repair of her large hiatal hernia.  Hospital Course:  She underwent robotic repair of her large type III hiatal hernia with partial fundoplication upper endoscopy.  She was kept overnight for observation.  She was started on clear liquids on postop day 0.  She initially started on water and then gradually advance to other clear liquids.  On postoperative day 1 she was tolerating clear liquids.  Her vital signs were stable.  She had some hypertension but no tachycardia.  No clinical signs of leak.  A postoperative upper GI showed no demonstration of leak or extravasation and expected repair after hiatal hernia repair.  There was some esophageal spasm.  She was advanced to a full liquid diet.  On day of discharge she was felt to be tolerating enough liquids in order to maintain her hydration.  We  discussed discharge instructions.  We discussed discharge diet plan.  She was felt stable for discharge.  She was maintained on perioperative chemical VTE prophylaxis  BP (!) 177/94 (BP Location: Right Arm)   Pulse 93   Temp 98.5 F (36.9 C) (Oral)   Resp 18   Ht '5\' 4"'$  (1.626 m)   Wt 76.4 kg   SpO2 92%   BMI 28.91 kg/m   Gen: alert, NAD, non-toxic appearing Pupils: equal, no scleral icterus Pulm: Lungs clear to auscultation, symmetric chest rise CV: regular rate and rhythm Abd: soft, expected mild tender, nondistended.  No cellulitis. No incisional hernia Ext: no edema, no calf tenderness Skin: no rash, no jaundice    Discharge Instructions  Discharge Instructions     Call MD for:   Complete by: As directed    Temperature >101   Call MD for:  hives   Complete by: As directed    Call MD for:  persistant dizziness or light-headedness   Complete by: As directed    Call MD for:  persistant nausea and vomiting   Complete by: As directed    Call MD for:  redness, tenderness, or signs of infection (pain, swelling, redness, odor or green/yellow discharge around incision site)   Complete by: As directed    Call MD for:  severe uncontrolled pain   Complete by: As directed    Diet full liquid   Complete by: As directed    Discharge instructions   Complete by: As directed    See [facility] discharge  instructions   Increase activity slowly   Complete by: As directed       Allergies as of 12/07/2021       Reactions   Lamisil [terbinafine] Rash        Medication List     STOP taking these medications    acetaminophen 650 MG CR tablet Commonly known as: TYLENOL Replaced by: acetaminophen 500 MG tablet       TAKE these medications    acetaminophen 500 MG tablet Commonly known as: TYLENOL Take 2 tablets (1,000 mg total) by mouth every 8 (eight) hours for 5 days. Replaces: acetaminophen 650 MG CR tablet   Cinnamon 500 MG Tabs Take 1,000 mg by mouth daily.    ezetimibe 10 MG tablet Commonly known as: ZETIA Take 10 mg by mouth daily.   lisinopril 20 MG tablet Commonly known as: ZESTRIL Take 20 mg by mouth daily.   metoprolol succinate 25 MG 24 hr tablet Commonly known as: TOPROL-XL Take 25 mg by mouth daily.   omeprazole 40 MG capsule Commonly known as: PRILOSEC TAKE ONE CAPSULE BY MOUTH EVERY DAY   rosuvastatin 10 MG tablet Commonly known as: CRESTOR Take 10 mg by mouth at bedtime.   simethicone 80 MG chewable tablet Commonly known as: Gas-X Chew 1 tablet (80 mg total) by mouth 4 (four) times daily as needed for up to 14 days for flatulence.   traMADol 50 MG tablet Commonly known as: Ultram Take 1 tablet (50 mg total) by mouth every 6 (six) hours as needed.   Vitamin D-3 125 MCG (5000 UT) Tabs Take 5,000 Units by mouth daily.   zinc gluconate 50 MG tablet Take 50 mg by mouth daily.        Follow-up Information     Beth Pickerel, MD. Schedule an appointment as soon as possible for a visit in 3 week(s).   Specialty: General Surgery Why: For wound re-check Contact information: [place] [address] [phone]                  The results of significant diagnostics from this hospitalization (including imaging, microbiology, ancillary and laboratory) are listed below for reference.    Significant Diagnostic Studies: DG UGI W SINGLE CM (SOL OR THIN BA)  Result Date: 12/07/2021 CLINICAL DATA:  Postop hiatal hernia repair. EXAM: DG UGI W SINGLE CM TECHNIQUE: Single contrast examination was then performed using thin liquid barium. This exam was performed by Rowe Robert, PA, and was supervised and interpreted by Dr. Candise Che. FLUOROSCOPY: Radiation Exposure Index (as provided by the fluoroscopic device): 36.9 mGy COMPARISON:  CT scan 09/20/2021 FINDINGS: Esophagus:  Diffuse esophageal spasm.  No esophageal mass. Esophageal motility:  Diffuse esophageal spasm. Gastroesophageal reflux:  None  visualized. Stomach: Expected postoperative changes related to hiatal hernia repair and fundoplication. Barium does pass through the fundoplication. The canal is narrow but this is likely postoperative edema. No obstruction and no evidence for leak/extravasation. IMPRESSION: 1. Expected postoperative changes related to hiatal hernia repair and fundoplication. No leak/extravasation. 2. Diffuse esophageal spasm. Electronically Signed   By: Marijo Sanes M.D.   On: 12/07/2021 12:57    Microbiology: No results found for this or any previous visit (from the past 240 hour(s)).   Labs: Basic Metabolic Panel: Recent Labs  Lab 12/07/21 0458  NA 135  K 3.7  CL 105  CO2 22  GLUCOSE 114*  BUN 14  CREATININE 0.80  CALCIUM 8.8*   Liver Function  Tests: No results for input(s): AST, ALT, ALKPHOS, BILITOT, PROT, ALBUMIN in the last 168 hours. No results for input(s): LIPASE, AMYLASE in the last 168 hours. No results for input(s): AMMONIA in the last 168 hours. CBC: Recent Labs  Lab 12/07/21 0458  WBC 12.7*  HGB 12.0  HCT 37.1  MCV 90.9  PLT 202   Cardiac Enzymes: No results for input(s): CKTOTAL, CKMB, CKMBINDEX, TROPONINI in the last 168 hours. BNP: BNP (last 3 results) No results for input(s): BNP in the last 8760 hours.  ProBNP (last 3 results) No results for input(s): PROBNP in the last 8760 hours.  CBG: Recent Labs  Lab 12/06/21 0552 12/06/21 1028 12/06/21 1132 12/06/21 1241  GLUCAP 132* 273* 279* 243*    Principal Problem:   History of repair of hiatal hernia   Time coordinating discharge: 25 min  Signed:  Gayland Curry, MD [facility], [place] [phone] 12/07/2021, 2:30 PM

## 2021-12-07 NOTE — Plan of Care (Signed)
  Problem: Education: Goal: Required Educational Video(s) Outcome: Completed/Met   Problem: Clinical Measurements: Goal: Ability to maintain clinical measurements within normal limits will improve Outcome: Completed/Met Goal: Postoperative complications will be avoided or minimized Outcome: Completed/Met   Problem: Skin Integrity: Goal: Demonstration of wound healing without infection will improve Outcome: Completed/Met

## 2021-12-07 NOTE — Discharge Instructions (Signed)
EATING AFTER YOUR ESOPHAGEAL SURGERY (Stomach Fundoplication, Hiatal Hernia repair, Achalasia surgery, etc)  ######################################################################  EAT Start with a full liquid diet (see below) Gradually transition to a high fiber diet with a fiber supplement over the next month after discharge.    WALK Walk an hour a day.  Control your pain to do that.    CONTROL PAIN Control pain so that you can walk, sleep, tolerate sneezing/coughing, go up/down stairs.  HAVE A BOWEL MOVEMENT DAILY Keep your bowels regular to avoid problems.  OK to try a laxative to override constipation.  OK to use an antidairrheal to slow down diarrhea.  Call if not better after 2 tries  CALL IF YOU HAVE PROBLEMS/CONCERNS Call if you are still struggling despite following these instructions. Call if you have concerns not answered by these instructions  ######################################################################   After your esophageal surgery, expect some sticking with swallowing over the next 1-2 months.    If food sticks when you eat, it is called "dysphagia".  This is due to swelling around your esophagus at the wrap & hiatal diaphragm repair.  It will gradually ease off over the next few months.  To help you through this temporary phase, we start you out on a full liquid diet.  Your first meal in the hospital was thin liquids.  You should have been given a full liquid diet by the time you left the hospital. Stay on clears and full liquids for the first week. Some patients may need to stay on a liquid diet for up to 2 weeks if having trouble swallowing.  Once tolerating that well, you can advance to pureed diet.   We ask patients to stay on a pureed diet for the 2nd-3rd week to avoid anything getting "stuck" near your recent surgery.  Don't be alarmed if your ability to swallow doesn't progress according to this plan.  Everyone is different and some diets can advance  more or less quickly.    It is often helpful to crush your medications or split them as they can sometimes stick, especially the first week or so.   Some BASIC RULES to follow are: Maintain an upright position whenever eating or drinking. Take small bites - just a teaspoon size bite at a time. Eat slowly.  It may also help to eat only one food at a time. Consider nibbling through smaller, more frequent meals & avoid the urge to eat BIG meals Do not push through feelings of fullness, nausea, or bloatedness Do not mix solid foods and liquids in the same mouthful Try not to "wash foods down" with large gulps of liquids. Avoid carbonated (bubbly/fizzy) drinks.   Avoid foods that make you feel gassy or bloated.  Start with bland foods first.  Wait on trying greasy, fried, or spicy meals until you are tolerating more bland solids well. Understand that it will be hard to burp and belch at first.  This gradually improves with time.  Expect to be more gassy/flatulent/bloated initially.  Walking will help your body manage it better. Consider using medications for bloating that contain simethicone such as  Maalox or Gas-X  Consider crushing her medications, especially smaller pills.  The ability to swallow pills should get easier after a few weeks Eat in a relaxed atmosphere & minimize distractions. Avoid talking while eating.   Do not use straws. Following each meal, sit in an upright position (90 degree angle) for 60 to 90 minutes.  Going for a short walk can  help as well If food does stick, don't panic.  Try to relax and let the food pass on its own.  Sipping WARM LIQUID such as strong hot black tea can also help slide it down.   Be gradual in changes & use common sense:  -If you easily tolerating a certain "level" of foods, advance to the next level gradually -If you are having trouble swallowing a particular food, then avoid it.   -If food is sticking when you advance your diet, go back to  thinner previous diet (the lower LEVEL) for 1-2 days.  LEVEL 2 = PUREED DIET  Start 1- 2 WEEKS AFTER SURGERY IF YOU ARE TOLERATING A FULL LIQUID DIET EASILY  -Foods in this group are pureed or blenderized to a smooth, mashed potato-like consistency.  -If necessary, the pureed foods can keep their shape with the addition of a thickening agent.   -Meat should be pureed to a smooth, pasty consistency.  Hot broth or gravy may be added to the pureed meat, approximately 1 oz. of liquid per 3 oz. serving of meat. -CAUTION:  If any foods do not puree into a smooth consistency, swallowing will be more difficult.  (For example, nuts or seeds sometimes do not blend well.)  Hot Foods Cold Foods  Pureed scrambled eggs and cheese Pureed cottage cheese  Baby cereals Thickened juices and nectars  Thinned cooked cereals (no lumps) Thickened milk or eggnog  Pureed [place] toast or pancakes Ensure  Mashed potatoes Ice cream  Pureed parsley, au gratin, scalloped potatoes, candied sweet potatoes Fruit or [place] ice, sherbet  Pureed buttered or alfredo noodles Plain yogurt  Pureed vegetables (no corn or peas) Instant breakfast  Pureed soups and creamed soups Smooth pudding, mousse, custard  Pureed scalloped apples Whipped gelatin  Gravies Sugar, syrup, honey, jelly  Sauces, cheese, tomato, barbecue, white, creamed Cream  Any baby food Creamer  Alcohol in moderation (not beer or champagne) Margarine  Coffee or tea Mayonnaise   Ketchup, mustard   Apple sauce   SAMPLE MENU:  PUREED DIET Breakfast Lunch Dinner  Orange juice, 1/2 cup Cream of wheat, 1/2 cup Pineapple juice, 1/2 cup Pureed [place], barley soup, 3/4 cup Pureed Hawaiian chicken, 3 oz  Scrambled eggs, mashed or blended with cheese, 1/2 cup Tea or coffee, 1 cup  Whole milk, 1 cup  Non-dairy creamer, 2 Tbsp. Mashed potatoes, 1/2 cup Pureed cooled broccoli, 1/2 cup Apple sauce, 1/2 cup Coffee or tea Mashed potatoes, 1/2 cup Pureed spinach,  1/2 cup Frozen yogurt, 1/2 cup Tea or coffee      LEVEL 3 = SOFT DIET  After your first 4 weeks, you can advance to a soft diet.   Keep on this diet until everything goes down easily.  Hot Foods Cold Foods  White fish Cottage cheese  Stuffed fish Junior baby fruit  Baby food meals Semi thickened juices  Minced soft cooked, scrambled, poached eggs nectars  Souffle & omelets Ripe mashed bananas  Cooked cereals Canned fruit, pineapple sauce, milk  potatoes Milkshake  Buttered or Alfredo noodles Custard  Cooked cooled vegetable Puddings, including tapioca  Sherbet Yogurt  Vegetable soup or alphabet soup Fruit ice, [place] ice  Gravies Whipped gelatin  Sugar, syrup, honey, jelly Junior baby desserts  Sauces:  Cheese, creamed, barbecue, tomato, white Cream  Coffee or tea Margarine   SAMPLE MENU:  LEVEL 3 Breakfast Lunch Dinner  Orange juice, 1/2 cup Oatmeal, 1/2 cup Scrambled eggs with cheese, 1/2 cup Decaffeinated tea,  1 cup Whole milk, 1 cup Non-dairy creamer, 2 Tbsp Pineapple juice, 1/2 cup Minced beef, 3 oz Gravy, 2 Tbsp Mashed potatoes, 1/2 cup Minced fresh broccoli, 1/2 cup Applesauce, 1/2 cup Coffee, 1 cup [place], barley soup, 3/4 cup Minced Hawaiian chicken, 3 oz Mashed potatoes, 1/2 cup Cooked spinach, 1/2 cup Frozen yogurt, 1/2 cup Non-dairy creamer, 2 Tbsp      LEVEL 4 = CHOPPED DIET  -After all the foods in level 3 (soft diet) are passing through well you should advance up to more chopped foods.  -It is still important to cut these foods into small pieces and eat slowly.  Hot Foods Cold Foods  Poultry Cottage cheese  Chopped Swedish meatballs Yogurt  Meat salads (ground or flaked meat) Milk  Flaked fish (tuna) Milkshakes  Poached or scrambled eggs Soft, cold, dry cereal  Souffles and omelets Fruit juices or nectars  Cooked cereals Chopped canned fruit  Chopped [place] toast or pancakes Canned fruit cocktail  Noodles or pasta (no rice) Pudding,  mousse, custard  Cooked vegetables (no frozen peas, corn, or mixed vegetables) Green salad  Canned small sweet peas Ice cream  Creamed soup or vegetable soup Fruit ice, [place] ice  Pureed vegetable soup or alphabet soup Non-dairy creamer  Ground scalloped apples Margarine  Gravies Mayonnaise  Sauces:  Cheese, creamed, barbecue, tomato, white Ketchup  Coffee or tea Mustard   SAMPLE MENU:  LEVEL 4 Breakfast Lunch Dinner  Orange juice, 1/2 cup Oatmeal, 1/2 cup Scrambled eggs with cheese, 1/2 cup Decaffeinated tea, 1 cup Whole milk, 1 cup Non-dairy creamer, 2 Tbsp Ketchup, 1 Tbsp Margarine, 1 tsp Salt, 1/4 tsp Sugar, 2 tsp Pineapple juice, 1/2 cup Ground beef, 3 oz Gravy, 2 Tbsp Mashed potatoes, 1/2 cup Cooked spinach, 1/2 cup Applesauce, 1/2 cup Decaffeinated coffee Whole milk Non-dairy creamer, 2 Tbsp Margarine, 1 tsp Salt, 1/4 tsp Pureed [place], barley soup, 3/4 cup Barbecue chicken, 3 oz Mashed potatoes, 1/2 cup Ground fresh broccoli, 1/2 cup Frozen yogurt, 1/2 cup Decaffeinated tea, 1 cup Non-dairy creamer, 2 Tbsp Margarine, 1 tsp Salt, 1/4 tsp Sugar, 1 tsp    LEVEL 5:  REGULAR FOODS  -Foods in this group are soft, moist, regularly textured foods.   -This level includes meat and breads, which tend to be the hardest things to swallow.   -Eat very slowly, chew well and continue to avoid carbonated drinks. -most people are at this level in 6 weeks  Hot Foods Cold Foods  Baked fish or skinned Soft cheeses - cottage cheese  Souffles and omelets Cream cheese  Eggs Yogurt  Stuffed shells Milk  Spaghetti with meat sauce Milkshakes  Cooked cereal Cold dry cereals (no nuts, dried fruit, coconut)  [place] toast or pancakes Crackers  Buttered toast Fruit juices or nectars  Noodles or pasta (no rice) Canned fruit  Potatoes (all types) Ripe bananas  Soft, cooked vegetables (no corn, lima, or baked beans) Peeled, ripe, fresh fruit  Creamed soups or vegetable soup Cakes  (no nuts, dried fruit, coconut)  Canned chicken noodle soup Plain doughnuts  Gravies Ice cream  Bacon dressing Pudding, mousse, custard  Sauces:  Cheese, creamed, barbecue, tomato, white Fruit ice, [place] ice, sherbet  Decaffeinated tea or coffee Whipped gelatin  Pork chops Regular gelatin   Canned fruited gelatin molds   Sugar, syrup, honey, jam, jelly   Cream   Non-dairy   Margarine   Oil   Mayonnaise   Ketchup   Mustard   TROUBLESHOOTING IRREGULAR BOWELS  1) Avoid extremes of bowel movements (no bad constipation/diarrhea)  2) Miralax 17gm mixed in 8oz. water or juice-daily. May use BID as needed.  3) Gas-x,Phazyme, etc. as needed for gas & bloating.  4) Soft,bland diet. No spicy,greasy,fried foods.  5) Prilosec over-the-counter as needed  6) May hold gluten/wheat products from diet to see if symptoms improve.  7) May try probiotics (Align, Activa, etc) to help calm the bowels down  7) If symptoms become worse call back immediately.    If you have any questions please call our office at [place]: [phone].

## 2022-01-06 DIAGNOSIS — D519 Vitamin B12 deficiency anemia, unspecified: Secondary | ICD-10-CM | POA: Diagnosis not present

## 2022-01-06 DIAGNOSIS — E1169 Type 2 diabetes mellitus with other specified complication: Secondary | ICD-10-CM | POA: Diagnosis not present

## 2022-01-06 DIAGNOSIS — E7849 Other hyperlipidemia: Secondary | ICD-10-CM | POA: Diagnosis not present

## 2022-01-06 DIAGNOSIS — Z23 Encounter for immunization: Secondary | ICD-10-CM | POA: Diagnosis not present

## 2022-01-06 DIAGNOSIS — Z6827 Body mass index (BMI) 27.0-27.9, adult: Secondary | ICD-10-CM | POA: Diagnosis not present

## 2022-01-06 DIAGNOSIS — M810 Age-related osteoporosis without current pathological fracture: Secondary | ICD-10-CM | POA: Diagnosis not present

## 2022-01-06 DIAGNOSIS — R002 Palpitations: Secondary | ICD-10-CM | POA: Diagnosis not present

## 2022-01-06 DIAGNOSIS — I10 Essential (primary) hypertension: Secondary | ICD-10-CM | POA: Diagnosis not present

## 2022-01-06 DIAGNOSIS — E782 Mixed hyperlipidemia: Secondary | ICD-10-CM | POA: Diagnosis not present

## 2022-01-06 DIAGNOSIS — D509 Iron deficiency anemia, unspecified: Secondary | ICD-10-CM | POA: Diagnosis not present

## 2022-01-06 DIAGNOSIS — D529 Folate deficiency anemia, unspecified: Secondary | ICD-10-CM | POA: Diagnosis not present

## 2022-01-10 DIAGNOSIS — I10 Essential (primary) hypertension: Secondary | ICD-10-CM | POA: Diagnosis not present

## 2022-01-10 DIAGNOSIS — E785 Hyperlipidemia, unspecified: Secondary | ICD-10-CM | POA: Diagnosis not present

## 2022-01-10 DIAGNOSIS — I25118 Atherosclerotic heart disease of native coronary artery with other forms of angina pectoris: Secondary | ICD-10-CM | POA: Diagnosis not present

## 2022-03-09 DIAGNOSIS — D529 Folate deficiency anemia, unspecified: Secondary | ICD-10-CM | POA: Diagnosis not present

## 2022-03-09 DIAGNOSIS — E7849 Other hyperlipidemia: Secondary | ICD-10-CM | POA: Diagnosis not present

## 2022-03-09 DIAGNOSIS — D519 Vitamin B12 deficiency anemia, unspecified: Secondary | ICD-10-CM | POA: Diagnosis not present

## 2022-03-09 DIAGNOSIS — D649 Anemia, unspecified: Secondary | ICD-10-CM | POA: Diagnosis not present

## 2022-03-09 DIAGNOSIS — E210 Primary hyperparathyroidism: Secondary | ICD-10-CM | POA: Diagnosis not present

## 2022-03-09 DIAGNOSIS — E782 Mixed hyperlipidemia: Secondary | ICD-10-CM | POA: Diagnosis not present

## 2022-03-09 DIAGNOSIS — E1169 Type 2 diabetes mellitus with other specified complication: Secondary | ICD-10-CM | POA: Diagnosis not present

## 2022-03-09 DIAGNOSIS — I10 Essential (primary) hypertension: Secondary | ICD-10-CM | POA: Diagnosis not present

## 2022-03-09 DIAGNOSIS — E039 Hypothyroidism, unspecified: Secondary | ICD-10-CM | POA: Diagnosis not present

## 2022-03-15 DIAGNOSIS — Z0000 Encounter for general adult medical examination without abnormal findings: Secondary | ICD-10-CM | POA: Diagnosis not present

## 2022-03-15 DIAGNOSIS — E782 Mixed hyperlipidemia: Secondary | ICD-10-CM | POA: Diagnosis not present

## 2022-03-15 DIAGNOSIS — Z23 Encounter for immunization: Secondary | ICD-10-CM | POA: Diagnosis not present

## 2022-03-15 DIAGNOSIS — E7849 Other hyperlipidemia: Secondary | ICD-10-CM | POA: Diagnosis not present

## 2022-03-15 DIAGNOSIS — R002 Palpitations: Secondary | ICD-10-CM | POA: Diagnosis not present

## 2022-03-15 DIAGNOSIS — Z6827 Body mass index (BMI) 27.0-27.9, adult: Secondary | ICD-10-CM | POA: Diagnosis not present

## 2022-03-15 DIAGNOSIS — E1169 Type 2 diabetes mellitus with other specified complication: Secondary | ICD-10-CM | POA: Diagnosis not present

## 2022-03-15 DIAGNOSIS — D509 Iron deficiency anemia, unspecified: Secondary | ICD-10-CM | POA: Diagnosis not present

## 2022-03-15 DIAGNOSIS — R0609 Other forms of dyspnea: Secondary | ICD-10-CM | POA: Diagnosis not present

## 2022-03-15 DIAGNOSIS — M810 Age-related osteoporosis without current pathological fracture: Secondary | ICD-10-CM | POA: Diagnosis not present

## 2022-03-15 DIAGNOSIS — I10 Essential (primary) hypertension: Secondary | ICD-10-CM | POA: Diagnosis not present

## 2022-03-18 DIAGNOSIS — I700 Atherosclerosis of aorta: Secondary | ICD-10-CM | POA: Diagnosis not present

## 2022-03-18 DIAGNOSIS — R918 Other nonspecific abnormal finding of lung field: Secondary | ICD-10-CM | POA: Diagnosis not present

## 2022-03-18 DIAGNOSIS — R0609 Other forms of dyspnea: Secondary | ICD-10-CM | POA: Diagnosis not present

## 2022-03-18 DIAGNOSIS — J9809 Other diseases of bronchus, not elsewhere classified: Secondary | ICD-10-CM | POA: Diagnosis not present

## 2022-03-18 DIAGNOSIS — R0602 Shortness of breath: Secondary | ICD-10-CM | POA: Diagnosis not present

## 2022-03-30 LAB — COLOGUARD

## 2022-04-18 DIAGNOSIS — E785 Hyperlipidemia, unspecified: Secondary | ICD-10-CM | POA: Diagnosis not present

## 2022-04-18 DIAGNOSIS — I25118 Atherosclerotic heart disease of native coronary artery with other forms of angina pectoris: Secondary | ICD-10-CM | POA: Diagnosis not present

## 2022-04-18 DIAGNOSIS — I10 Essential (primary) hypertension: Secondary | ICD-10-CM | POA: Diagnosis not present

## 2022-04-25 DIAGNOSIS — Z23 Encounter for immunization: Secondary | ICD-10-CM | POA: Diagnosis not present

## 2022-04-28 ENCOUNTER — Ambulatory Visit (INDEPENDENT_AMBULATORY_CARE_PROVIDER_SITE_OTHER): Payer: Medicare Other | Admitting: Internal Medicine

## 2022-04-28 ENCOUNTER — Encounter: Payer: Self-pay | Admitting: Internal Medicine

## 2022-04-28 DIAGNOSIS — R0602 Shortness of breath: Secondary | ICD-10-CM

## 2022-04-28 DIAGNOSIS — I10 Essential (primary) hypertension: Secondary | ICD-10-CM

## 2022-04-28 DIAGNOSIS — I25118 Atherosclerotic heart disease of native coronary artery with other forms of angina pectoris: Secondary | ICD-10-CM | POA: Diagnosis not present

## 2022-04-28 MED ORDER — IRBESARTAN 150 MG PO TABS: 150.0000 mg | ORAL_TABLET | Freq: Every day | ORAL | 11 refills | Status: AC

## 2022-04-28 NOTE — Progress Notes (Signed)
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   Sleeping  without nocturnal  or early am exacerbation  of respiratory  c/o's or need for noct saba. Also denies any obvious fluctuation of symptoms with weather or environmental changes or other aggravating or alleviating factors except as outlined above   No unusual exposure hx or h/o childhood pna/ asthma or knowledge of premature birth.  Current Allergies, Complete Past Medical History, Past Surgical History, Family History, and Social History were reviewed in [employer] record.  ROS  The following are not active complaints unless bolded Hoarseness, sore throat, dysphagia/globus dental problems, itching, sneezing,  nasal congestion or discharge of excess mucus or purulent secretions, ear ache,   fever, chills, sweats, unintended wt loss or wt gain, classically pleuritic or exertional cp,  orthopnea pnd or arm/hand swelling  or leg swelling, presyncope, palpitations, abdominal pain, anorexia, nausea, vomiting, diarrhea  or change in bowel habits or change in bladder habits, change in stools or change in urine, dysuria, hematuria,  rash, arthralgias,  visual complaints, headache, numbness, weakness or ataxia or problems with walking or coordination,  change in mood or  memory.               Past Medical History:  Diagnosis Date   Anemia    recent iron transfusions in 2023   Arthritis    Hands   Diabetes mellitus, type II ([place])    diet and exercise controlled   Dyspnea    Elevated temperature    Notmal temp is 99-100   GERD (gastroesophageal reflux disease)    Hypercalcemia    Hyperlipidemia    Hypertension    Vitamin D deficiency     Outpatient Medications Prior to Visit  Medication Sig Dispense Refill   Cholecalciferol (VITAMIN D-3) 5000 units TABS Take 5,000 Units by mouth daily.     Cinnamon 500 MG TABS Take 1,000 mg by mouth daily.     ezetimibe (ZETIA) 10 MG tablet Take 10 mg by mouth daily.     lisinopril (PRINIVIL,ZESTRIL) 20 MG tablet Take 20 mg by mouth daily.     metoprolol succinate (TOPROL-XL) 25 MG 24 hr tablet Take 25 mg by mouth daily.     omeprazole (PRILOSEC) 40 MG capsule  No longer taking       rosuvastatin (CRESTOR) 10 MG tablet Take 10 mg by mouth at bedtime.     traMADol (ULTRAM) 50 MG tablet Take 1 tablet (50 mg total) by mouth every 6 (six) hours as needed. 20 tablet 0   zinc gluconate  50 MG tablet Take 50 mg by mouth daily.     simethicone (GAS-X) 80 MG chewable tablet Chew 1 tablet (80 mg total) by mouth 4 (four) times daily as needed for up to 14 days for flatulence. 100 tablet    No facility-administered medications prior to visit.     Objective:     BP 132/78 (BP Location: Right Arm)   Pulse 74   Temp 98.4 F (36.9 C) (Temporal)   Ht '5\' 4"'$  (1.626 m)   Wt 155 lb 12.8 oz (70.7 kg)   SpO2 96% Comment: ra  BMI 26.74 kg/m   SpO2: 96 % (ra)  Amb wf slt raspy voice    HEENT : Oropharynx  clear      Nasal turbinates nl    NECK :  without  apparent JVD/ palpable Nodes/TM    LUNGS: no acc muscle use,  Nl contour chest which is clear to A and P bilaterally without cough on insp or  exp maneuvers   CV:  RRR  no s3 or murmur or increase in P2, and no edema   ABD:  soft and nontender with nl inspiratory excursion in the supine position. No bruits or organomegaly appreciated   MS:  Nl gait/ ext warm without deformities Or obvious joint restrictions  calf tenderness, cyanosis or clubbing    SKIN: warm and dry without lesions    NEURO:  alert, approp, nl sensorium with  no motor or cerebellar deficits apparent.     I personally reviewed images and agree with radiology impression as follows:   Chest CTa 03/18/22 1. No acute pulmonary embolism.  2. There are patchy ground-glass opacities and bronchial wall  thickening as well a LEFT basilar platelike opacity. Differential  considerations include pulmonary edema with superimposed atelectasis  versus atypical infection.  3. Multiple pulmonary nodules. Most significant: 5 mm RIGHT middle  lobe pulmonary nodule. Per Fleischner Society Guidelines, if patient  is low risk for malignancy, no routine follow-up imaging is  recommended      Assessment   Shortness of breath Onset 2021 p covid vax while on ACEi  Chest CTa 03/18/22 1. No acute pulmonary embolism.  2. There are patchy ground-glass opacities and bronchial wall  thickening as well a LEFT basilar platelike opacity. Differential  considerations include pulmonary edema with superimposed atelectasis  versus atypical infection.  3. Multiple pulmonary nodules. Most significant: 5 mm RIGHT middle  lobe pulmonary nodule. Per Fleischner Society Guidelines, if patient  is low risk for malignancy, no routine follow-up imaging is  recommended - try off acei 04/28/2022 for unexplained resting sob/hoarseness and globus sensation x 6 week trial then regroup - 04/28/2022   Walked on RA  x  3  lap(s) =  approx 450  ft  @ fast pace, stopped due to end of study with lowest 02 sats 96% and no sob   Although there are clearly abnormalities on CT scan, they should probably be  considered "microscopic" since not obvious on plain cxr .     In the setting of obvious "macroscopic" symptoms (unexplained episodes of sob) ,  I am very reluctatnt to embark on an invasive w/u at this point but will arrange consevative  follow up and in the meantime see what we can do to address the patient's subjective concerns.    >>> try off acei as above and f/u in 6 weeks  Essential hypertension, benign D/c acei 04/28/2022 unexplained sob/ hoarseness/ globus  In the best  review of chronic cough to date ( NEJM 2016 375 4680-3212) ,  ACEi are now felt to cause cough in up to  20% of pts which is a 4 fold increase from previous reports and does not include the variety of non-specific complaints we see in pulmonary clinic in pts on ACEi but previously attributed to another dx like  Copd/asthma and  include PNDS, "throat congestion"/globus, "bronchitis", unexplained dysp atypical /refractory GERD symptoms like dysphagia nea and noct "strangling" sensations, and hoarseness, but also  atypical /refractory GERD symptoms like dysphagia and "bad heartburn"   The only way I know  to prove this is not an "ACEi Case" is a trial off ACEi x a minimum of 6 weeks then regroup.   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  Total time for H and P, chart review, counseling,  directly observing portions of ambulatory 02 saturation study/ and generating customized AVS unique to this office visit / same day charting > 45 min new pt eval                    Christinia Gully, MD 04/28/2022

## 2022-04-28 NOTE — Assessment & Plan Note (Signed)
D/c acei 04/28/2022 unexplained sob/ hoarseness/ globus  In the best review of chronic cough to date ( NEJM 2016 375 [id number]) ,  ACEi are now felt to cause cough in up to  20% of pts which is a 4 fold increase from previous reports and does not include the variety of non-specific complaints we see in pulmonary clinic in pts on ACEi but previously attributed to another dx like  Copd/asthma and  include PNDS, "throat congestion"/globus, "bronchitis", unexplained dysp atypical /refractory GERD symptoms like dysphagia nea and noct "strangling" sensations, and hoarseness, but also  atypical /refractory GERD symptoms like dysphagia and "bad heartburn"   The only way I know  to prove this is not an "ACEi Case" is a trial off ACEi x a minimum of 6 weeks then regroup.   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  Total time for H and P, chart review, counseling,  directly observing portions of ambulatory 02 saturation study/ and generating customized AVS unique to this office visit / same day charting > 45 min new pt eval

## 2022-04-28 NOTE — Patient Instructions (Addendum)
Try priosec 40 mg Take 30-60 min before first meal of the day x 2 weeks then stop it to see what effect it  has on your nausea   Stop lisinopril and start avapro 150 mg one daily and a lot of your respiratory symptoms should resolve over the next several weeks.  To get the most out of exercise, you need to be continuously aware that you are short of breath, but never out of breath, for at least 30 minutes daily. As you improve, it will actually be easier for you to do the same amount of exercise  in  30 minutes so always push to the level where you are short of breath.     Make sure you check your oxygen saturations at highest level of activity   Please schedule a follow up office visit in 6 weeks, call sooner if needed

## 2022-04-28 NOTE — Assessment & Plan Note (Addendum)
Onset 2021 p covid vax while on ACEi  Chest CTa 03/18/22 1. No acute pulmonary embolism.  2. There are patchy ground-glass opacities and bronchial wall  thickening as well a LEFT basilar platelike opacity. Differential  considerations include pulmonary edema with superimposed atelectasis  versus atypical infection.  3. Multiple pulmonary nodules. Most significant: 5 mm RIGHT middle  lobe pulmonary nodule. Per Fleischner Society Guidelines, if patient  is low risk for malignancy, no routine follow-up imaging is  recommended - try off acei 04/28/2022 for unexplained resting sob/hoarseness and globus sensation x 6 week trial then regroup - 04/28/2022   Walked on RA  x  3  lap(s) =  approx 450  ft  @ fast pace, stopped due to end of study with lowest 02 sats 96% and no sob   Although there are clearly abnormalities on CT scan, they should probably be considered "microscopic" since not obvious on plain cxr .     In the setting of obvious "macroscopic" symptoms (unexplained episodes of sob) ,  I am very reluctatnt to embark on an invasive w/u at this point but will arrange consevative  follow up and in the meantime see what we can do to address the patient's subjective concerns.    >>> try off acei as above and f/u in 6 weeks

## 2022-06-16 ENCOUNTER — Encounter (INDEPENDENT_AMBULATORY_CARE_PROVIDER_SITE_OTHER): Payer: Self-pay | Admitting: Gastroenterology

## 2022-06-16 ENCOUNTER — Ambulatory Visit (INDEPENDENT_AMBULATORY_CARE_PROVIDER_SITE_OTHER): Payer: Medicare Other | Admitting: Gastroenterology

## 2022-06-16 VITALS — BP 143/90 | HR 77 | Temp 98.4°F | Ht 63.0 in | Wt 156.9 lb

## 2022-06-16 DIAGNOSIS — R140 Abdominal distension (gaseous): Secondary | ICD-10-CM | POA: Diagnosis not present

## 2022-06-16 DIAGNOSIS — K589 Irritable bowel syndrome without diarrhea: Secondary | ICD-10-CM | POA: Insufficient documentation

## 2022-06-16 DIAGNOSIS — K580 Irritable bowel syndrome with diarrhea: Secondary | ICD-10-CM | POA: Diagnosis not present

## 2022-06-16 DIAGNOSIS — K219 Gastro-esophageal reflux disease without esophagitis: Secondary | ICD-10-CM | POA: Diagnosis not present

## 2022-06-16 DIAGNOSIS — R197 Diarrhea, unspecified: Secondary | ICD-10-CM | POA: Insufficient documentation

## 2022-06-16 NOTE — Patient Instructions (Addendum)
Continue Imodium 1/2 tablet daily Continue dicyclomine 10 mg in AM Explained presumed etiology of IBS symptoms. Patient was counseled about the benefit of implementing a low FODMAP to improve symptoms and recurrent episodes. A dietary list was provided to the patient. Also, the patient was counseled about the benefit of avoiding stressing situations and potential environmental triggers leading to symptomatology. Schedule SIBO breath test Start omeprazole 40 mg qday Follow up with PCP regarding H/H and iron stores surveillance The patient was found to have elevated blood pressure when vital signs were checked in the office. The blood pressure was rechecked by the nursing staff and it was found be persistently elevated >140/90 mmHg. I personally advised to the patient to follow up closely with his PCP for hypertension control.

## 2022-06-16 NOTE — Progress Notes (Signed)
Beth Garcia, M.D. Gastroenterology & Hepatology [facility] [address] [place], [place] [address]  Primary Care Physician: Caryl Bis, MD [place] [address]  I will communicate my assessment and recommendations to the referring MD via EMR.  Problems: Iron deficiency anemia Beth Garcia Large hiatal hernia (8 cm) s/p robotic repair Bloating and diarrhea  History of Present Illness: Beth Garcia is a 71 y.o. female PMH GERD, DM, HTN, HLD, hyperparathyroidism s/p gland resection and large hiatal hernia complicated by Beth Garcia Garcia, coming for follow up of IDA, bloating and diarrhea.   The patient was last seen on 01/11/2021. At that time, the patient was advised to have repeat CBC and iron stores.  As these were low, the patient was advised to start iron mentation, but given poor response she underwent a capsule endoscopy but did not show any small bowel sources.  Patient underwent repair of her large type III hiatal hernia on December 06, 2021 by Dr. Redmond Pulling.  Notably, most recent labs from 01/06/2022 showed a hemoglobin of 12.8 and her iron stores showed a ferritin of 37, iron of 58.  Patient reports that after her hernia repair she has developed some gastrointestinal issues. She states that she had some issues with bloating and loose stools prior to having her surgery, but these worsened significantly after she had her surgery. She reports having recurrent episodes of bloating. She states that she has had intermittent diarrhea, possibly every 1-2 months - she has noticed this can last for a few hours but they can be very severe. States she has tried taking 1/2 tablet of Imodium and peptobismol recently as recommended by her daughter which led to significant improvement of her symptoms. She also reports having some lower abdominal discomfort and cramping after having these episodes. Also reports bloating frequently. Has tried to  avoid dairy but no significant improvement.   She also reports that she is having a frequent burping on a daily basis, which she reports has a "rotten eggs taste".  This has been significant concern for her as she never had these symptoms before and they are present on a daily basis.  She reports having nausea intermittently after she had surgery as well. Believes this is getting slightly better with time but is still present.  She is currently taking omeprazole 40 mg as needed for heartburn. She does take Gas-x when she is very bloated.   She is also taking Bentyl recently, which she believes has helped decreasing the diarrhea frequency.  The patient denies having any dysphagia, heartburn, vomiting, fever, chills, hematochezia, melena, hematemesis, jaundice, pruritus or weight loss.  Last EGD:08/19/2020, showed an 8 cm hiatal hernia with Beth Garcia erosions, normal stomach otherwise and normal duodenum (biopsies negative for celiac disease).  Last Colonoscopy: 08/19/2020, a normal terminal ileum with presence of diverticula in the sigmoid colon   Past Medical History: Past Medical History:  Diagnosis Date   Anemia    recent iron transfusions in 2023   Arthritis    Hands   Diabetes mellitus, type II ([place])    diet and exercise controlled   Dyspnea    Elevated temperature    Notmal temp is 99-100   GERD (gastroesophageal reflux disease)    Hypercalcemia    Hyperlipidemia    Hypertension    Vitamin D deficiency     Past Surgical History: Past Surgical History:  Procedure Laterality Date   ABDOMINAL HYSTERECTOMY  bilateral breast biopsy     BIOPSY  08/19/2020   Procedure: BIOPSY;  Surgeon: Harvel Quale, MD;  [facility]: [facility];  Service: Gastroenterology;;  duodenum   COLONOSCOPY W/ POLYPECTOMY     COLONOSCOPY WITH PROPOFOL N/A 08/19/2020   Procedure: COLONOSCOPY WITH PROPOFOL;  Surgeon: Harvel Quale, MD;  Location: [facility];  Service:  Gastroenterology;  Laterality: N/A;  7:30   ESOPHAGOGASTRODUODENOSCOPY (EGD) WITH PROPOFOL N/A 08/19/2020   Procedure: ESOPHAGOGASTRODUODENOSCOPY (EGD) WITH PROPOFOL;  Surgeon: Harvel Quale, MD;  [facility]: [facility];  Service: Gastroenterology;  Laterality: N/A;   GIVENS CAPSULE STUDY N/A 08/11/2021   Procedure: GIVENS CAPSULE STUDY;  Surgeon: Harvel Quale, MD;  Location: [facility];  Service: Gastroenterology;  Laterality: N/A;  7:30   PARATHYROIDECTOMY Right 02/01/2018   Procedure: RIGHT PARATHYROIDECTOMY;  Surgeon: Armandina Gemma, MD;  Location: [facility];  Service: General;  Laterality: Right;   TONSILLECTOMY     UPPER GI ENDOSCOPY N/A 12/06/2021   Procedure: UPPER ENDOSCOPY;  Surgeon: [name], MD;  Location: [facility];  Service: General;  Laterality: N/A;   XI ROBOTIC ASSISTED HIATAL HERNIA REPAIR N/A 12/06/2021   Procedure: XI ROBOTIC ASSISTED LARGE HIATAL HERNIA REPAIR WITH PARTIAL FUNDOPLICATION;  Surgeon: [name], MD;  Location: [facility];  Service: General;  Laterality: N/A;    Family History: Family History  Problem Relation Age of Onset   Hypertension Mother    Hyperlipidemia Mother    Hyperlipidemia Father    Stroke Father    Cancer Father    Hypertension Sister    Hyperlipidemia Sister    Hypertension Brother    Hyperlipidemia Brother     Social History: Social History   Tobacco Use  Smoking Status Never  Smokeless Tobacco Never   Social History   Substance and Sexual Activity  Alcohol Use No   Social History   Substance and Sexual Activity  Drug Use No    Allergies: Allergies  Allergen Reactions   Lamisil [Terbinafine] Rash    Medications: Current Outpatient Medications  Medication Sig Dispense Refill   Cholecalciferol (VITAMIN D-3) 5000 units TABS Take 5,000 Units by mouth daily.     Cinnamon 500 MG TABS Take 1,000 mg by mouth daily.     dicyclomine (BENTYL) 10 MG capsule Take 10 mg by mouth daily at 6 (six) AM.      ezetimibe (ZETIA) 10 MG tablet Take 10 mg by mouth daily.     irbesartan (AVAPRO) 150 MG tablet Take 1 tablet (150 mg total) by mouth daily. 30 tablet 11   loperamide (IMODIUM) 2 MG capsule Take 2 mg by mouth as needed for diarrhea or loose stools. 1/2 tablet daily.     metoprolol succinate (TOPROL-XL) 25 MG 24 hr tablet Take 25 mg by mouth daily.     omeprazole (PRILOSEC) 40 MG capsule TAKE ONE CAPSULE BY MOUTH EVERY DAY 90 capsule 3   rosuvastatin (CRESTOR) 10 MG tablet Take 10 mg by mouth at bedtime.     zinc gluconate 50 MG tablet Take 50 mg by mouth daily.     simethicone (GAS-X) 80 MG chewable tablet Chew 1 tablet (80 mg total) by mouth 4 (four) times daily as needed for up to 14 days for flatulence. 100 tablet    No current facility-administered medications for this visit.    Review of Systems: GENERAL: negative for malaise, night sweats HEENT: No changes in hearing or vision, no nose bleeds or other nasal problems.  NECK: Negative for lumps, goiter, pain and significant neck swelling RESPIRATORY: Negative for cough, wheezing CARDIOVASCULAR: Negative for chest pain, leg swelling, palpitations, orthopnea GI: SEE HPI MUSCULOSKELETAL: Negative for joint pain or swelling, back pain, and muscle pain. SKIN: Negative for Garcia, rash PSYCH: Negative for sleep disturbance, mood disorder and recent psychosocial stressors. HEMATOLOGY Negative for prolonged bleeding, bruising easily, and swollen nodes. ENDOCRINE: Negative for cold or heat intolerance, polyuria, polydipsia and goiter. NEURO: negative for tremor, gait imbalance, syncope and seizures. The remainder of the review of systems is noncontributory.   Physical Exam: BP (!) 143/90 (BP Location: Left Arm, Patient Position: Sitting, Cuff Size: Large)   Pulse 77   Temp 98.4 F (36.9 C) (Temporal)   Ht '5\' 3"'$  (1.6 m)   Wt 156 lb 14.4 oz (71.2 kg)   BMI 27.79 kg/m  GENERAL: The patient is AO x3, in no acute distress. HEENT: Head is  normocephalic and atraumatic. EOMI are intact. Mouth is well hydrated and without Garcia. NECK: Supple. No masses LUNGS: Clear to auscultation. No presence of rhonchi/wheezing/rales. Adequate chest expansion HEART: RRR, normal s1 and s2. ABDOMEN: Soft, nontender, no guarding, no peritoneal signs, and nondistended. BS +. No masses. EXTREMITIES: Without any cyanosis, clubbing, rash, Garcia or edema. NEUROLOGIC: AOx3, no focal motor deficit. SKIN: no jaundice, no rashes  Imaging/Labs: as above  I personally reviewed and interpreted the available labs, imaging and endoscopic files.  Impression and Plan: Beth Garcia is a 71 y.o. female PMH GERD, DM, HTN, HLD, hyperparathyroidism s/p gland resection and large hiatal hernia complicated by Beth Garcia Garcia, coming for follow up of IDA, bloating and diarrhea.  Patient has presented worsening gastrointestinal complaints including abdominal distention and diarrhea after undergoing hiatal hernia surgery.  She had some milder symptoms in the past but these aggravated after her surgery.  I suspect her symptoms are likely related to IBS-D but we will perform a SIBO breath test to rule out bacterial overgrowth.  For now, she can continue Imodium and dicyclomine as needed, but she may also try omeprazole 40 mg every day to relieve the burping episodes she is presenting.  Notably, after she underwent the hiatal hernia surgery, her dysphagia has completely resolved and her iron deficiency anemia has stabilized.  She should follow-up with her PCP regarding her CBC and iron stores surveillance.  The patient was found to have elevated blood pressure when vital signs were checked in the office. The blood pressure was rechecked by the nursing staff and it was found be persistently elevated >140/90 mmHg. I personally advised to the patient to follow up closely with his PCP for hypertension control.  -Continue Imodium 1/2 tablet daily -Continue dicyclomine 10 mg in  AM -Explained presumed etiology of IBS symptoms. Patient was counseled about the benefit of implementing a low FODMAP to improve symptoms and recurrent episodes. A dietary list was provided to the patient. Also, the patient was counseled about the benefit of avoiding stressing situations and potential environmental triggers leading to symptomatology. -Schedule SIBO breath test -Start omeprazole 40 mg qday -Follow up with PCP regarding H/H and iron stores surveillance  All questions were answered.      Beth Peppers, MD Gastroenterology and Hepatology [facility]

## 2022-06-17 DIAGNOSIS — D485 Neoplasm of uncertain behavior of skin: Secondary | ICD-10-CM | POA: Diagnosis not present

## 2022-06-17 DIAGNOSIS — Z6827 Body mass index (BMI) 27.0-27.9, adult: Secondary | ICD-10-CM | POA: Diagnosis not present

## 2022-06-17 DIAGNOSIS — L989 Disorder of the skin and subcutaneous tissue, unspecified: Secondary | ICD-10-CM | POA: Diagnosis not present

## 2022-06-17 DIAGNOSIS — R030 Elevated blood-pressure reading, without diagnosis of hypertension: Secondary | ICD-10-CM | POA: Diagnosis not present

## 2022-06-17 DIAGNOSIS — L820 Inflamed seborrheic keratosis: Secondary | ICD-10-CM | POA: Diagnosis not present

## 2022-06-20 ENCOUNTER — Encounter: Payer: Self-pay | Admitting: Internal Medicine

## 2022-06-20 ENCOUNTER — Ambulatory Visit (INDEPENDENT_AMBULATORY_CARE_PROVIDER_SITE_OTHER): Payer: Medicare Other | Admitting: Internal Medicine

## 2022-06-20 VITALS — BP 142/84 | HR 72 | Temp 98.2°F | Ht 63.0 in | Wt 158.0 lb

## 2022-06-20 DIAGNOSIS — I25118 Atherosclerotic heart disease of native coronary artery with other forms of angina pectoris: Secondary | ICD-10-CM

## 2022-06-20 DIAGNOSIS — R058 Other specified cough: Secondary | ICD-10-CM

## 2022-06-20 DIAGNOSIS — I10 Essential (primary) hypertension: Secondary | ICD-10-CM

## 2022-06-20 DIAGNOSIS — R0602 Shortness of breath: Secondary | ICD-10-CM

## 2022-06-20 NOTE — Progress Notes (Unsigned)
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  referred to pulmonary clinic in [place]  04/28/2022 by Gar Ponto  for sob p 3 vaccinations in 2021 in setting gerd s/p NF in [place] Jun 2023      History of Present Illness  04/28/2022  Pulmonary/ 1st office eval/ Melvyn Novas / [facility]  Chief Complaint  Patient presents with   Consult    SOB   Dyspnea:  not necessarily with ex  sometimes at rest x 2 breaths  Cough: just a little raspy voice issues / sensation globus  Sleep: flat bed a couple of pillows since NF surgery June 2023  SABA use: none Rec Try priosec 40 mg Take 30-60 min before first meal of the day x 2 weeks then stop it to see what effect it  has on your nausea  Stop lisinopril and start avapro 150 mg one daily and a lot of your respiratory symptoms should resolve over the next several weeks. To get the most out of exercise, you need to be continuously aware that you are short of breath, but never out of breath, for at least 30 minutes daily.  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   Sleeping as above  without nocturnal  or early am exacerbation  of respiratory  c/o's or need for noct saba. Also denies any obvious  fluctuation of symptoms with weather or environmental changes or other aggravating or alleviating factors except as outlined above   No unusual exposure hx or h/o childhood pna/ asthma or knowledge of premature birth.  Current Allergies, Complete Past Medical History, Past Surgical History, Family History, and Social History were reviewed in [employer] record.  ROS  The following are not active complaints unless bolded Hoarseness, sore throat, dysphagia, dental problems, itching, sneezing,  nasal congestion or discharge of excess mucus or purulent secretions, ear ache,   fever, chills, sweats, unintended wt loss or wt gain, classically pleuritic or exertional cp,  orthopnea pnd or arm/hand swelling  or leg swelling, presyncope, palpitations, abdominal pain, anorexia, nausea, vomiting, diarrhea  or change in bowel habits or change in bladder habits, change in stools or change in urine, dysuria, hematuria,  rash, arthralgias, visual complaints, headache, numbness, weakness or ataxia or problems with walking or coordination,  change in mood or  memory.        Current Meds  Medication Sig   Cholecalciferol (VITAMIN D-3) 5000 units TABS Take 5,000 Units by mouth daily.   Cinnamon 500 MG TABS Take 1,000 mg by mouth daily.   dicyclomine (BENTYL) 10 MG capsule Take 10  mg by mouth daily at 6 (six) AM.   ezetimibe (ZETIA) 10 MG tablet Take 10 mg by mouth daily.   irbesartan (AVAPRO) 150 MG tablet Take 1 tablet (150 mg total) by mouth daily.   loperamide (IMODIUM) 2 MG capsule Take 2 mg by mouth as needed for diarrhea or loose stools. 1/2 tablet daily.   metoprolol succinate (TOPROL-XL) 25 MG 24 hr tablet Take 25 mg by mouth daily.   omeprazole (PRILOSEC) 40 MG capsule TAKE ONE CAPSULE BY MOUTH EVERY DAY   rosuvastatin (CRESTOR) 10 MG tablet Take 10 mg by mouth at bedtime.   zinc gluconate 50 MG tablet Take 50 mg by mouth daily.                           Past Medical  History:  Diagnosis Date   Anemia    recent iron transfusions in 2023   Arthritis    Hands   Diabetes mellitus, type II ([place])    diet and exercise controlled   Dyspnea    Elevated temperature    Notmal temp is 99-100   GERD (gastroesophageal reflux disease)    Hypercalcemia    Hyperlipidemia    Hypertension    Vitamin D deficiency        Objective:    06/20/2022     158   06/16/22 156 lb 14.4 oz (71.2 kg)  04/28/22 155 lb 12.8 oz (70.7 kg)  12/06/21 168 lb 6.9 oz (76.4 kg)    Vital signs reviewed  06/20/2022  - Note at rest 02 sats  96% on RA   General appearance:    amb slt hoarse somber wf nad   HEENT : Oropharynx  clear     Nasal turbinates nl    NECK :  without  apparent JVD/ palpable Nodes/TM    LUNGS: no acc muscle use,  Nl contour chest which is clear to A and P bilaterally without cough on insp or exp maneuvers   CV:  RRR  no s3 or murmur or increase in P2, and no edema   ABD:  soft and nontender with nl inspiratory excursion in the supine position. No bruits or organomegaly appreciated   MS:  Nl gait/ ext warm without deformities Or obvious joint restrictions  calf tenderness, cyanosis or clubbing    SKIN: warm and dry without lesions    NEURO:  alert, approp, nl sensorium with  no motor or cerebellar deficits apparent.        CXR PA and Lateral:   06/20/2022 :    Rec but not done      I personally reviewed images and agree with radiology impression as follows:   Chest CTa 03/18/22 1. No acute pulmonary embolism.  2. There are patchy ground-glass opacities and bronchial wall  thickening as well a LEFT basilar platelike opacity. Differential  considerations include pulmonary edema with superimposed atelectasis  versus atypical infection.  3. Multiple pulmonary nodules. Most significant: 5 mm RIGHT middle  lobe pulmonary nodule.       Assessment

## 2022-06-20 NOTE — Patient Instructions (Addendum)
To get the most out of exercise, you need to be continuously aware that you are short of breath, but never out of breath, for at least 30 minutes daily. As you improve, it will actually be easier for you to do the same amount of exercise  in  30 minutes so always push to the level where you are short of breath.     Make sure you check your oxygen saturations at highest level of activity (NOT after stopping)    Try prilosec otc 40 mg  Take 30-60 min before first meal of the day and Pepcid ac (famotidine) 20 mg one after supper x 4 weeks then stop prilosec and replace pepcid 20 mg twice daily   GERD (REFLUX)  is an extremely common cause of respiratory symptoms just like yours , many times with no obvious heartburn at all.    It can be treated with medication, but also with lifestyle changes including elevation of the head of your bed or chair 10-30 degrees.   Smoking cessation, avoidance of late meals, excessive alcohol, and avoid fatty foods, chocolate, peppermint, colas, red wine, and acidic juices such as orange juice.  NO MINT OR MENTHOL PRODUCTS SO NO COUGH DROPS  USE SUGARLESS CANDY INSTEAD (Jolley ranchers or Stover's or Life Savers) or even ice chips will also do - the key is to swallow to prevent all throat clearing. NO OIL BASED VITAMINS - use powdered substitutes.  Avoid fish oil when coughing.   If still clearing throat, try tessalon 200 mg three times daily to keep from clearing throat   Please remember to go to the  x-ray department  for your tests - we will call you with the results when they are available    Please schedule a follow up office visit in 6 weeks, call sooner if needed

## 2022-06-21 DIAGNOSIS — R058 Other specified cough: Secondary | ICD-10-CM | POA: Insufficient documentation

## 2022-06-21 DIAGNOSIS — H3563 Retinal hemorrhage, bilateral: Secondary | ICD-10-CM | POA: Diagnosis not present

## 2022-06-21 NOTE — Assessment & Plan Note (Signed)
Onset 2021 p covid vax while on ACEi  Chest CTa 03/18/22 1. No acute pulmonary embolism.  2. There are patchy ground-glass opacities and bronchial wall  thickening as well a LEFT basilar platelike opacity. Differential  considerations include pulmonary edema with superimposed atelectasis  versus atypical infection.  3. Multiple pulmonary nodules. Most significant: 5 mm RIGHT middle  lobe pulmonary nodule.   - try off acei 04/28/2022 for unexplained resting sob/hoarseness and globus sensation x 6 week trial then regroup - 04/28/2022   Walked on RA  x  3  lap(s) =  approx 450  ft  @ fast pace, stopped due to end of study with lowest 02 sats 96% and no sob   Suspect multifactorial but mostly a conditioning issues  Rec submax ex 30 min daily and check serial sats at highest level "collect the dots to connect the dots"   Needs f/u HRCT next ov if not better.

## 2022-06-21 NOTE — Assessment & Plan Note (Signed)
Onset 2021 ? Related to covid vax  D/c ACEi 04/28/22 some better 06/20/2022 but still hoarse/ throat clearing - 06/20/2022 rec max gerd rx/ hard rock candy/ tessalon 200 and f/u in 6 weeks   Upper airway cough syndrome (previously labeled PNDS),  is so named because it's frequently impossible to sort out how much is  CR/sinusitis with freq throat clearing (which can be related to primary GERD)   vs  causing  secondary (" extra esophageal")  GERD from wide swings in gastric pressure that occur with throat clearing, often  promoting self use of mint and menthol lozenges that reduce the lower esophageal sphincter tone and exacerbate the problem further in a cyclical fashion.   These are the same pts (now being labeled as having "irritable larynx syndrome" by some cough centers) who not infrequently have a history of having failed to tolerate ace inhibitors,  dry powder inhalers or biphosphonates or report having atypical/extraesophageal reflux symptoms that don't respond to standard doses of PPI  and are easily confused as having aecopd or asthma flares by even experienced allergists/ pulmonologists (myself included).   Recs as above/ f/u in 6 weeks with low threshold to refer to ENT at [facility] (Dr Addison Bailey)  Discussed in detail all the  indications, usual  risks and alternatives  relative to the benefits with patient who agrees to proceed with Rx as outlined.

## 2022-06-21 NOTE — Assessment & Plan Note (Addendum)
D/c acei 04/28/2022 unexplained sob/ hoarseness/ globus  Although even in retrospect it may not be clear the ACEi contributed to the pt's symptoms,   adding them back at this point or in the future would risk confusion in interpretation of non-specific respiratory symptoms to which this patient is prone  ie  Better not to muddy the waters here.   >>> continue avapro 150 mg / f/u PCP for fine tuning and long term rx   Each maintenance medication was reviewed in detail including emphasizing most importantly the difference between maintenance and prns and under what circumstances the prns are to be triggered using an action plan format where appropriate.  Total time for H and P, chart review, counseling,  and generating customized AVS unique to this office visit / same day charting = 33 min

## 2022-07-04 DIAGNOSIS — J0110 Acute frontal sinusitis, unspecified: Secondary | ICD-10-CM | POA: Diagnosis not present

## 2022-07-04 DIAGNOSIS — R519 Headache, unspecified: Secondary | ICD-10-CM | POA: Diagnosis not present

## 2022-07-14 DIAGNOSIS — I25118 Atherosclerotic heart disease of native coronary artery with other forms of angina pectoris: Secondary | ICD-10-CM | POA: Diagnosis not present

## 2022-07-14 DIAGNOSIS — I10 Essential (primary) hypertension: Secondary | ICD-10-CM | POA: Diagnosis not present

## 2022-07-14 DIAGNOSIS — E785 Hyperlipidemia, unspecified: Secondary | ICD-10-CM | POA: Diagnosis not present

## 2022-07-15 DIAGNOSIS — D649 Anemia, unspecified: Secondary | ICD-10-CM | POA: Diagnosis not present

## 2022-07-15 DIAGNOSIS — I10 Essential (primary) hypertension: Secondary | ICD-10-CM | POA: Diagnosis not present

## 2022-07-15 DIAGNOSIS — E7849 Other hyperlipidemia: Secondary | ICD-10-CM | POA: Diagnosis not present

## 2022-07-15 DIAGNOSIS — E782 Mixed hyperlipidemia: Secondary | ICD-10-CM | POA: Diagnosis not present

## 2022-07-15 DIAGNOSIS — D529 Folate deficiency anemia, unspecified: Secondary | ICD-10-CM | POA: Diagnosis not present

## 2022-07-15 DIAGNOSIS — D519 Vitamin B12 deficiency anemia, unspecified: Secondary | ICD-10-CM | POA: Diagnosis not present

## 2022-07-15 DIAGNOSIS — E1165 Type 2 diabetes mellitus with hyperglycemia: Secondary | ICD-10-CM | POA: Diagnosis not present

## 2022-07-20 DIAGNOSIS — R002 Palpitations: Secondary | ICD-10-CM | POA: Diagnosis not present

## 2022-07-20 DIAGNOSIS — M810 Age-related osteoporosis without current pathological fracture: Secondary | ICD-10-CM | POA: Diagnosis not present

## 2022-07-20 DIAGNOSIS — R0609 Other forms of dyspnea: Secondary | ICD-10-CM | POA: Diagnosis not present

## 2022-07-20 DIAGNOSIS — I10 Essential (primary) hypertension: Secondary | ICD-10-CM | POA: Diagnosis not present

## 2022-07-20 DIAGNOSIS — Z6827 Body mass index (BMI) 27.0-27.9, adult: Secondary | ICD-10-CM | POA: Diagnosis not present

## 2022-07-20 DIAGNOSIS — E1169 Type 2 diabetes mellitus with other specified complication: Secondary | ICD-10-CM | POA: Diagnosis not present

## 2022-07-20 DIAGNOSIS — D509 Iron deficiency anemia, unspecified: Secondary | ICD-10-CM | POA: Diagnosis not present

## 2022-07-20 DIAGNOSIS — E7849 Other hyperlipidemia: Secondary | ICD-10-CM | POA: Diagnosis not present

## 2022-08-02 NOTE — Progress Notes (Unsigned)
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  referred to pulmonary clinic in [place]  04/28/2022 by Beth Garcia  for sob p 3 vaccinations in 2021 in setting gerd s/p NF in [place] Jun 2023  History of Present Illness  04/28/2022  Pulmonary/ 1st office eval/ Beth Garcia / Beth Garcia [facility]  Chief Complaint  Patient presents with   Consult    SOB   Dyspnea:  not necessarily with ex  sometimes at rest x 2 breaths  Cough: just a little raspy voice issues / sensation globus  Sleep: flat bed a couple of pillows since NF surgery June 2023  SABA use: none Rec Try priosec 40 mg Take 30-60 min before first meal of the day x 2 weeks then stop it to see what effect it  has on your nausea  Stop lisinopril and start avapro 150 mg one daily and a lot of your respiratory symptoms should resolve over the next several weeks. To get the most out of exercise, you need to be continuously aware that you are short of breath, but never out of breath, for at least 30 minutes daily.  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 /no regular aerobics/ still occ sob at rest for secs to min resolves spontaneously   Cough: none but clears throat frequently / voice fatigue  Sleeping: in a recliner almost flat since surgery  SABA use: none 02: none  Rec To get the most out of exercise, you need to be continuously aware that you are short of breath, but never out of breath, for at least 30 minutes daily Make sure you check your oxygen saturations at highest level of activity (NOT after stopping)  Try prilosec otc 40 mg  Take 30-60 min before first meal of the day and Pepcid  ac (famotidine) 20 mg one after supper x 4 weeks then stop prilosec and replace pepcid 20 mg twice daily  GERD  diet  If still clearing throat, try tessalon 200 mg three times daily to keep from clearing throat > did not use    08/03/2022  f/u ov/[place] office/Beth Garcia re: UACS maint on gerd  rx Chief Complaint  Patient presents with   Follow-up    Breathing has improved since last ov   Dyspnea:  occ sob at rest and better p one breath Cough: none Sleeping: 30 degrees  SABA use: none  02: none  Covid status: x 3 vax    No obvious day to day or daytime variability or assoc excess/ purulent sputum or mucus plugs or hemoptysis or cp or chest tightness, subjective wheeze or overt sinus or hb symptoms.   Sleeping as above  without nocturnal  or early am exacerbation  of respiratory  c/o's or need for noct saba. Also denies any obvious fluctuation of symptoms with weather or environmental changes or other aggravating or alleviating factors except as outlined above   No unusual exposure hx or h/o childhood pna/ asthma or knowledge of premature birth.  Current Allergies, Complete Past Medical History, Past Surgical History,  Family History, and Social History were reviewed in [employer] record.  ROS  The following are not active complaints unless bolded Hoarseness, sore throat, dysphagia, dental problems, itching, sneezing,  nasal congestion or discharge of excess mucus or purulent secretions, ear ache,   fever, chills, sweats, unintended wt loss or wt gain, classically pleuritic or exertional cp,  orthopnea pnd or arm/hand swelling  or leg swelling, presyncope, palpitations, abdominal pain, anorexia, nausea, vomiting, diarrhea  or change in bowel habits or change in bladder habits, change in stools or change in urine, dysuria, hematuria,  rash, arthralgias, visual complaints, headache, numbness, weakness or ataxia or problems with walking or coordination,  change in mood or   memory.        Current Meds  Medication Sig   Cholecalciferol (VITAMIN D-3) 5000 units TABS Take 5,000 Units by mouth daily.   Cinnamon 500 MG TABS Take 1,000 mg by mouth daily.   dicyclomine (BENTYL) 10 MG capsule Take 10 mg by mouth daily at 6 (six) AM.   ezetimibe (ZETIA) 10 MG tablet Take 10 mg by mouth daily.   irbesartan (AVAPRO) 150 MG tablet Take 1 tablet (150 mg total) by mouth daily.   loperamide (IMODIUM) 2 MG capsule Take 2 mg by mouth as needed for diarrhea or loose stools. 1/2 tablet daily.   metoprolol succinate (TOPROL-XL) 25 MG 24 hr tablet Take 25 mg by mouth daily.   omeprazole (PRILOSEC) 40 MG capsule TAKE ONE CAPSULE BY MOUTH EVERY DAY   rosuvastatin (CRESTOR) 10 MG tablet Take 10 mg by mouth at bedtime.   zinc gluconate 50 MG tablet Take 50 mg by mouth daily.                               Past Medical History:  Diagnosis Date   Anemia    recent iron transfusions in 2023   Arthritis    Hands   Diabetes mellitus, type II ([place])    diet and exercise controlled   Dyspnea    Elevated temperature    Notmal temp is 99-100   GERD (gastroesophageal reflux disease)    Hypercalcemia    Hyperlipidemia    Hypertension    Vitamin D deficiency        Objective:    Wts  08/03/2022       159  06/20/2022     158   06/16/22 156 lb 14.4 oz (71.2 kg)  04/28/22 155 lb 12.8 oz (70.7 kg)  12/06/21 168 lb 6.9 oz (76.4 kg)    Vital signs reviewed  08/03/2022  - Note at rest 02 sats  96% on RA   General appearance:    amb wf classic voice fatigue   HEENT : Oropharynx  clear         NECK :  without  apparent JVD/ palpable Nodes/TM    LUNGS: no acc muscle use,  Nl contour chest which is clear to A and P bilaterally without cough on insp or exp maneuvers   CV:  RRR  no s3 or murmur or increase in P2, and no edema   ABD:  soft and nontender with nl inspiratory excursion in the supine position. No bruits or organomegaly appreciated   MS:  Nl gait/ ext  warm without deformities Or obvious joint restrictions  calf tenderness, cyanosis or clubbing    SKIN: warm and dry without lesions    NEURO:  alert, approp, nl sensorium with  no motor or cerebellar deficits apparent.              Assessment

## 2022-08-03 ENCOUNTER — Ambulatory Visit (INDEPENDENT_AMBULATORY_CARE_PROVIDER_SITE_OTHER): Payer: Medicare Other | Admitting: Internal Medicine

## 2022-08-03 ENCOUNTER — Encounter: Payer: Self-pay | Admitting: Internal Medicine

## 2022-08-03 ENCOUNTER — Other Ambulatory Visit (INDEPENDENT_AMBULATORY_CARE_PROVIDER_SITE_OTHER): Payer: Self-pay | Admitting: Gastroenterology

## 2022-08-03 VITALS — BP 146/84 | HR 74 | Ht 63.0 in | Wt 159.8 lb

## 2022-08-03 DIAGNOSIS — R0602 Shortness of breath: Secondary | ICD-10-CM | POA: Diagnosis not present

## 2022-08-03 NOTE — Patient Instructions (Addendum)
1) stop omeprazole  2) increase pepcid to 20 mg one twice daily after meals x one week (after breakfast and supper)  3) after one week if no cough reduce pepcid to 20 mg after supper x one more week and  then stop  If worse cough or acid symptoms go back one step and try the next step after 2 weeks with 2 weeks between each step down.    Pulmonary follow up is as needed - call if you wish to see a throat specialist about your voice

## 2022-08-03 NOTE — Assessment & Plan Note (Signed)
Onset 2021 p covid vax while on ACEi  Chest CTa 03/18/22 1. No acute pulmonary embolism.  2. There are patchy ground-glass opacities and bronchial wall  thickening as well a LEFT basilar platelike opacity. Differential  considerations include pulmonary edema with superimposed atelectasis  versus atypical infection.  3. Multiple pulmonary nodules. Most significant: 5 mm RIGHT middle  lobe pulmonary nodule.   - try off acei 04/28/2022 for unexplained resting sob/hoarseness and globus sensation x 6 week trial then regroup - 04/28/2022   Walked on RA  x  3  lap(s) =  approx 450  ft  @ fast pace, stopped due to end of study with lowest 02 sats 96% and no sob   Better to her satisfaction and wishes to wean off ppi > advided on gradual taper off h2's if cough control adequate > if not consider ENT eval next  and f/u with CT chest if any worsening doe though believe she is very low risk for MAI or bronchiectasis so pulmonary f/u can be prn          Each maintenance medication was reviewed in detail including emphasizing most importantly the difference between maintenance and prns and under what circumstances the prns are to be triggered using an action plan format where appropriate.  Total time for H and P, chart review, counseling, and generating customized AVS unique to this office visit / same day charting = 25 min final summary f/u ov

## 2022-08-15 DIAGNOSIS — K638219 Small intestinal bacterial overgrowth, unspecified: Secondary | ICD-10-CM | POA: Diagnosis not present

## 2022-08-15 DIAGNOSIS — K63829 Intestinal methanogen overgrowth, unspecified: Secondary | ICD-10-CM | POA: Diagnosis not present

## 2022-08-18 ENCOUNTER — Telehealth (INDEPENDENT_AMBULATORY_CARE_PROVIDER_SITE_OTHER): Payer: Self-pay | Admitting: Gastroenterology

## 2022-08-18 DIAGNOSIS — K63829 Intestinal methanogen overgrowth, unspecified: Secondary | ICD-10-CM

## 2022-08-18 DIAGNOSIS — K638219 Small intestinal bacterial overgrowth, unspecified: Secondary | ICD-10-CM

## 2022-08-18 MED ORDER — RIFAXIMIN 550 MG PO TABS: 550.0000 mg | ORAL_TABLET | Freq: Three times a day (TID) | ORAL | 0 refills | Status: AC

## 2022-08-18 MED ORDER — NEOMYCIN SULFATE 500 MG PO TABS: 500.0000 mg | ORAL_TABLET | Freq: Two times a day (BID) | ORAL | 0 refills | Status: AC

## 2022-08-18 NOTE — Telephone Encounter (Signed)
I spoke with the patient and made her aware of the bacterial overgrowth and that We have sent in two antibiotic to the pharmacy [facility]. I did let her know that the Xifaxan may require a PA.

## 2022-08-18 NOTE — Telephone Encounter (Signed)
I received the results of the most recent breath test 08/15/2022 which showed elevated methane and hydrogen production at  90 minutes consistent with methanogenic and hydrogen producing bacteria (SIMO and SIBO).Due to this, I will recommend a combination of neomycin and Xifaxan for 2 weeks.  Hi Crystal,  Can you please call the patient and tell the patient the test came back positive for bacterial overgrowth?  I will send a prescription for 2 different antibiotics she is to take for next 2 weeks compliantly.  Thanks,  Maylon Peppers, MD Gastroenterology and Hepatology [facility]

## 2022-08-19 ENCOUNTER — Telehealth (INDEPENDENT_AMBULATORY_CARE_PROVIDER_SITE_OTHER): Payer: Self-pay

## 2022-08-19 NOTE — Telephone Encounter (Signed)
Per Patient she can not afford the co payment of $800.00 for the Xifaxan. I have a few samples of Xifaxin #30. Per Dr. Jenetta Downer we need to have the patient hold taking the neonycin, until we have the full course of Xifaxan #42 on hand to give the patient. The Drug rep for Xifaxan coming in next week to bring samples. We will set enough aside for her. I called and left a detailed message regarding the above.

## 2022-08-19 NOTE — Telephone Encounter (Signed)
I spoke to patient she is aware we will be trying to get her some samples and she is aware to hold off taking the other medication until we get her samples of Xifaxan.

## 2022-08-23 NOTE — Telephone Encounter (Signed)
Patient aware we have the samples here at the front desk for her to pick up . We gave her #42 to take one tablet three times per day for 14 days.

## 2022-08-27 IMAGING — RF DG UGI W SINGLE CM
4 series · 14 of 15 positions shown · non-contrast
Comparison: CT scan 09/20/2021

CLINICAL DATA: Postop hiatal hernia repair.

EXAM:
DG UGI W SINGLE CM
TECHNIQUE: Single contrast examination was then performed using thin liquid
barium. This exam was performed by Blain Jumper, PA, and was
supervised and interpreted by Dr. Hattie.
FLUOROSCOPY:
Radiation Exposure Index (as provided by the fluoroscopic device):
36.9 mGy

[Series 1: cp_standard · 4 of 444 frames shown (1 of 4)]
[frame 37/444]
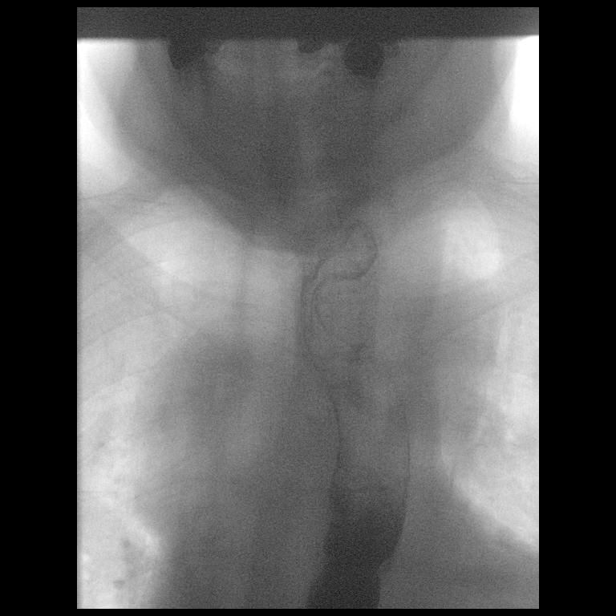
[frame 67/444]
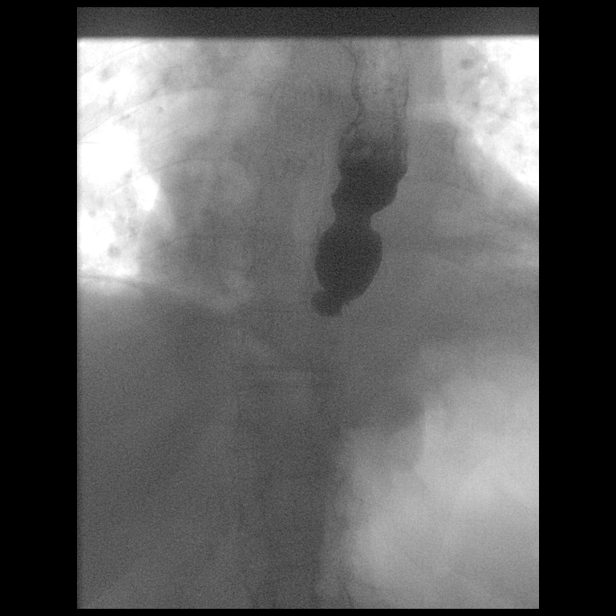
[frame 223/444]
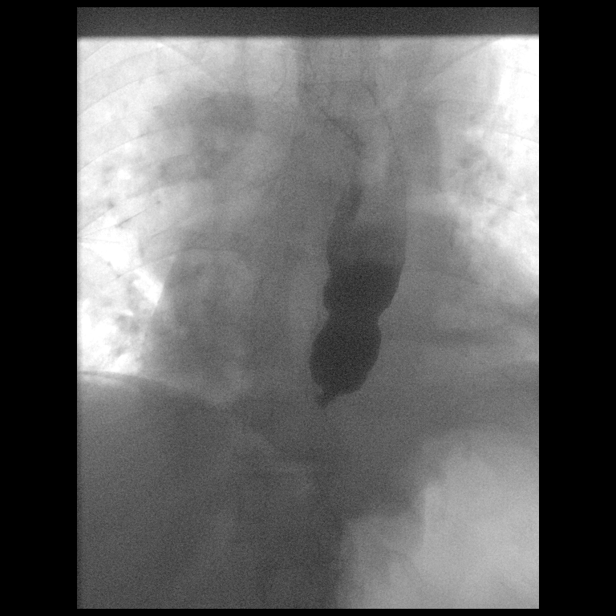
[frame 378/444]
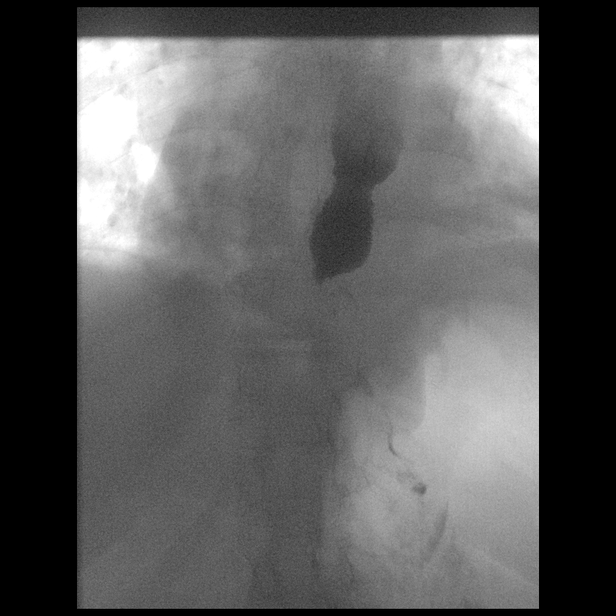

[Series 2: cp_standard · 3 of 210 frames shown (2 of 4)]
[frame 32/210]
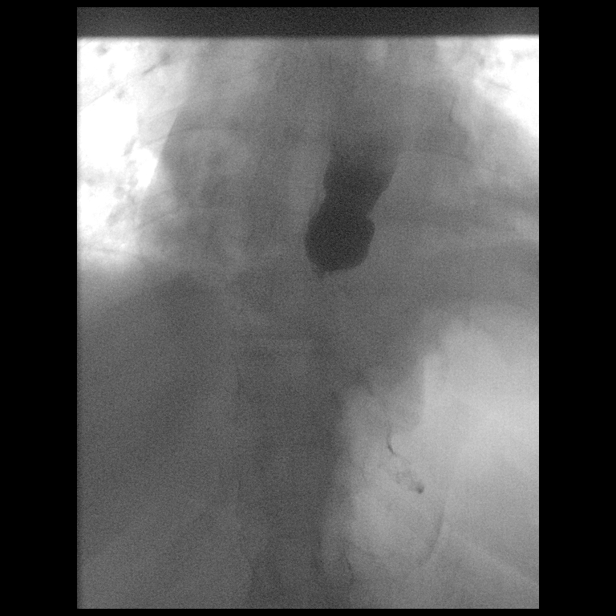
[frame 106/210]
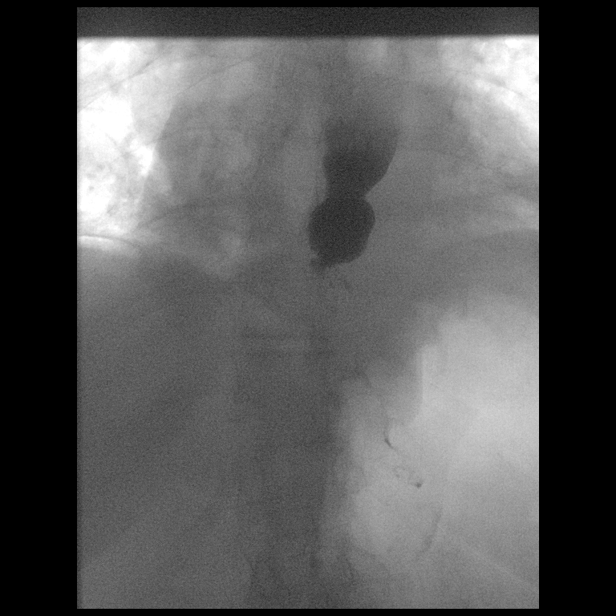
[frame 179/210]
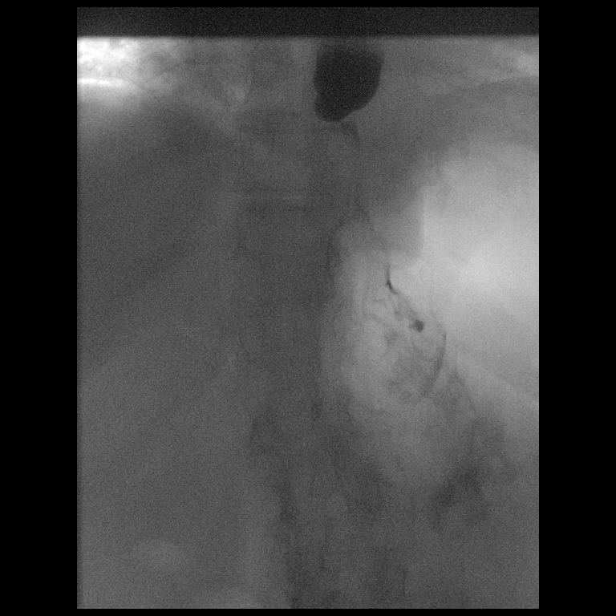

[Series 3: cp_standard · 3 of 447 frames shown (3 of 4)]
[frame 165/447]
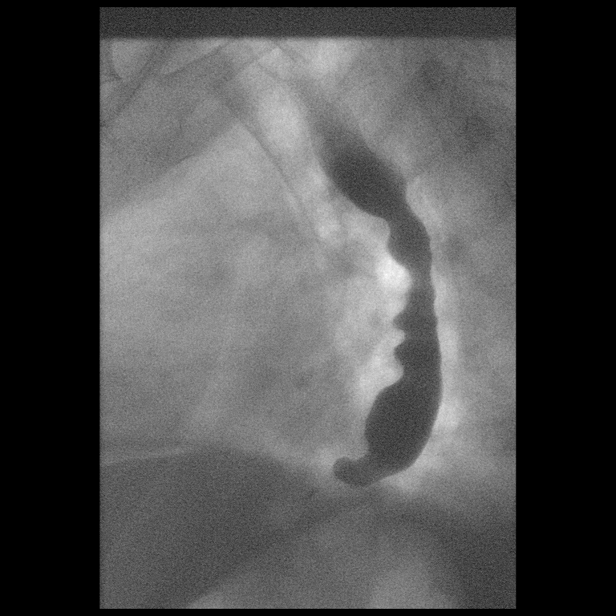
[frame 224/447]
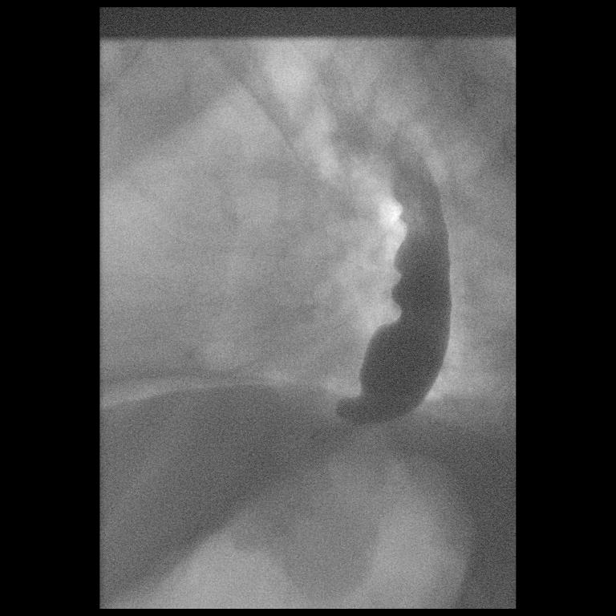
[frame 380/447]
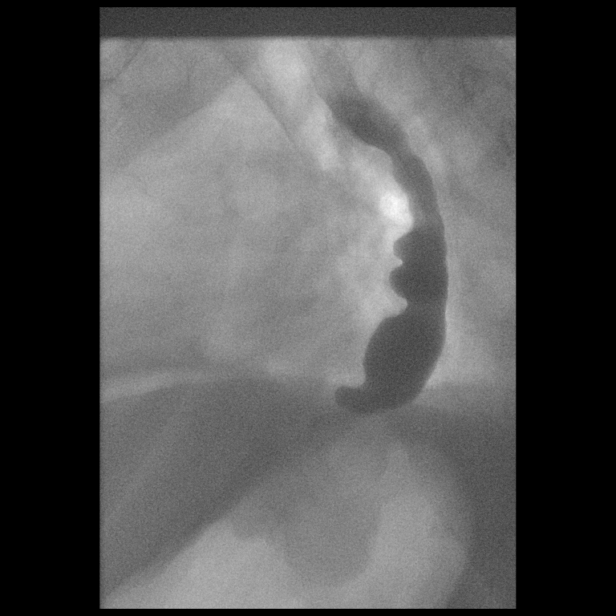

[Series 4: cp_standard · 4 of 270 frames shown (4 of 4)]
[frame 41/270]
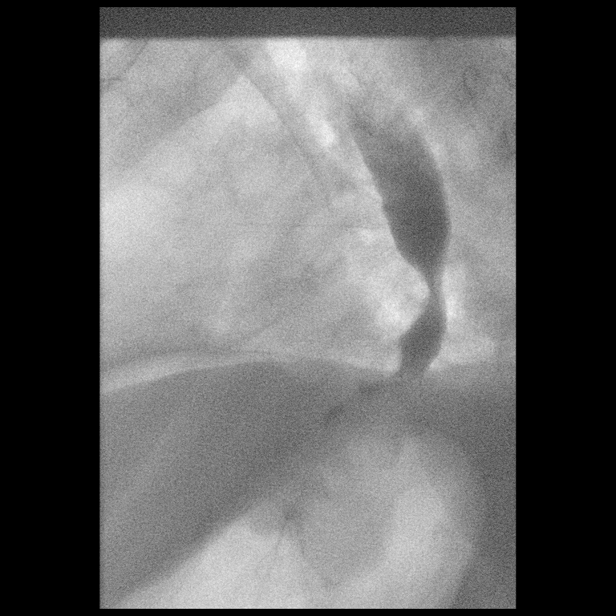
[frame 52/270]
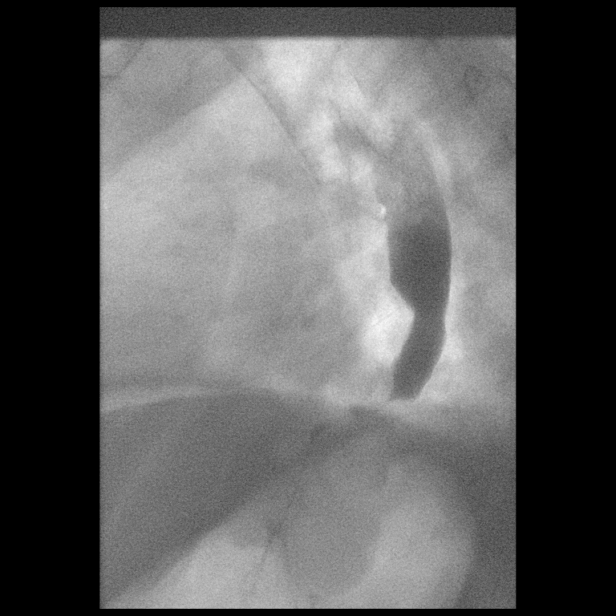
[frame 136/270]
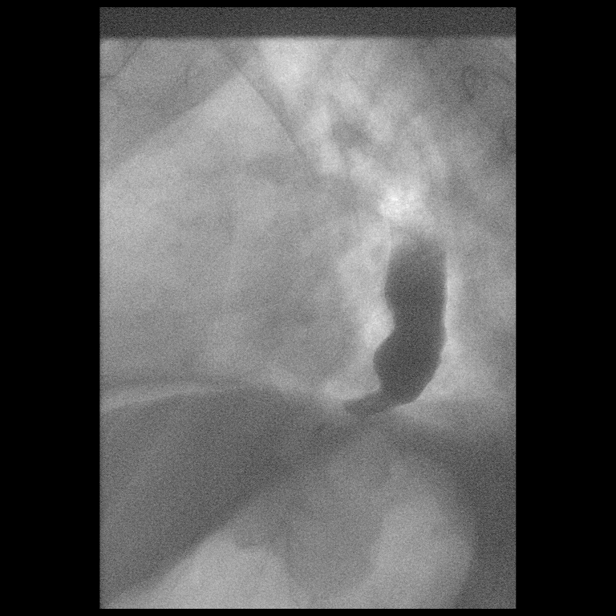
[frame 230/270]
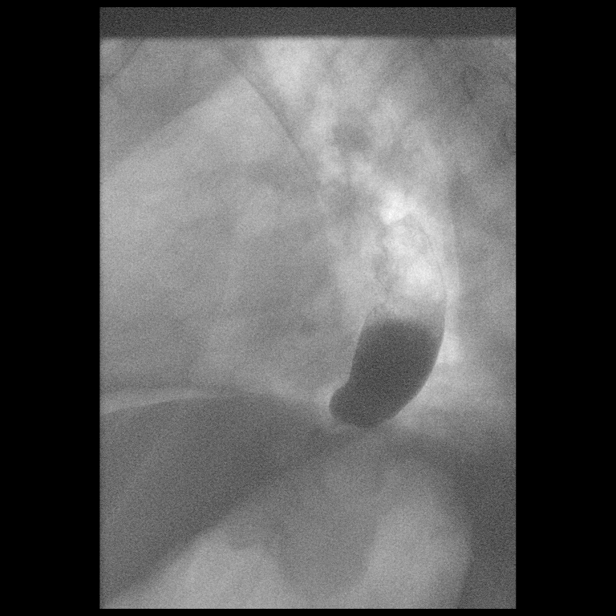

[14 of 15 positions shown; findings below may reference images not displayed]

FINDINGS: Esophagus:  Diffuse esophageal spasm.  No esophageal mass.

Esophageal motility:  Diffuse esophageal spasm.

Gastroesophageal reflux:  None visualized.

Stomach: Expected postoperative changes related to hiatal hernia
repair and fundoplication. Barium does pass through the
fundoplication. The canal is narrow but this is likely postoperative
edema. No obstruction and no evidence for leak/extravasation.
IMPRESSION: 1. Expected postoperative changes related to hiatal hernia repair
and fundoplication. No leak/extravasation.
2. Diffuse esophageal spasm.

## 2022-09-19 ENCOUNTER — Ambulatory Visit (INDEPENDENT_AMBULATORY_CARE_PROVIDER_SITE_OTHER): Payer: Medicare Other | Admitting: Gastroenterology

## 2022-09-19 ENCOUNTER — Encounter (INDEPENDENT_AMBULATORY_CARE_PROVIDER_SITE_OTHER): Payer: Self-pay | Admitting: Gastroenterology

## 2022-09-19 VITALS — BP 152/81 | HR 71 | Temp 98.0°F | Ht 63.0 in | Wt 158.9 lb

## 2022-09-19 DIAGNOSIS — R110 Nausea: Secondary | ICD-10-CM | POA: Diagnosis not present

## 2022-09-19 DIAGNOSIS — K63829 Intestinal methanogen overgrowth, unspecified: Secondary | ICD-10-CM | POA: Insufficient documentation

## 2022-09-19 NOTE — Patient Instructions (Addendum)
Start daily probiotic intake - Can use Align or VSL#3 brands or take one that has Lactobacillus and Bifidobacterium Can take ginger in the morning daily for nausea Call back if presenting recurrent symptoms The patient was found to have elevated blood pressure when vital signs were checked in the office. The blood pressure was rechecked by the nursing staff and it was found be persistently elevated >140/90 mmHg. I personally advised to the patient to follow up closely with PCP for hypertension control.

## 2022-09-19 NOTE — Progress Notes (Signed)
Maylon Peppers, M.D. Gastroenterology & Hepatology [facility] [address] [place],  [address]  Primary Care Physician: Caryl Bis, MD [place] [address]  I will communicate my assessment and recommendations to the referring MD via EMR.  Problems: Iron deficiency anemia Cameron lesions Large hiatal hernia (8 cm) s/p robotic repair SIMO/SIBO  History of Present Illness: Beth Garcia is a 72 y.o. female with PMH GERD, DM, HTN, HLD, hyperparathyroidism s/p gland resection and large hiatal hernia complicated by [name] lesions, coming for follow up of IDA, bloating and diarrhea.   The patient was last seen on 06/16/2022. At that time, the patient was advised to continue Imodium as needed and dicyclomine.  Was advised to implement a low fiber diet.  She was also advised to start omeprazole 40 mg every day.  Patient was advised to start omeprazole 40 mg daily for burping episodes.  SIBO breath test was performed on 08/18/2022 which showed abnormalities consistent with SIMO/SIBO.  Due to this, the patient was given a 2-week course of neomycin and Xifaxan.  Patient reports that she has felt much better since finishing her medications - states the bloating, diarrhea, fecal urgency, fecal seepage and burping have completely resolved. She also states that she has not presented any more foul smelling burps. States that today in the morning she has some watery stool but not similar to what it was.  She reports feeling nauseated in the morning. This started after her hiatal hernia was repaired. She does not usually feel like eating in the morning, usually after having coffee she will have nausea. She does not vomit. Not taking any antiemetics.  The patient denies having any vomiting, fever, chills, hematochezia, melena, hematemesis, abdominal distention, abdominal pain, diarrhea, jaundice, pruritus or weight loss.  Last  EGD:08/19/2020, showed an 8 cm hiatal hernia with [name] erosions, normal stomach otherwise and normal duodenum (biopsies negative for celiac disease).  Last Colonoscopy: 08/19/2020, a normal terminal ileum with presence of diverticula in the sigmoid colon   Past Medical History: Past Medical History:  Diagnosis Date   Anemia    recent iron transfusions in 2023   Arthritis    Hands   Diabetes mellitus, type II ([place])    diet and exercise controlled   Dyspnea    Elevated temperature    Notmal temp is 99-100   GERD (gastroesophageal reflux disease)    Hypercalcemia    Hyperlipidemia    Hypertension    Vitamin D deficiency     Past Surgical History: Past Surgical History:  Procedure Laterality Date   ABDOMINAL HYSTERECTOMY     bilateral breast biopsy     BIOPSY  08/19/2020   Procedure: BIOPSY;  Surgeon: Harvel Quale, MD;  [facility];  Service: Gastroenterology;;  duodenum   COLONOSCOPY W/ POLYPECTOMY     COLONOSCOPY WITH PROPOFOL N/A 08/19/2020   Procedure: COLONOSCOPY WITH PROPOFOL;  Surgeon: Harvel Quale, MD;  [facility];  Service: Gastroenterology;  Laterality: N/A;  7:30   ESOPHAGOGASTRODUODENOSCOPY (EGD) WITH PROPOFOL N/A 08/19/2020   Procedure: ESOPHAGOGASTRODUODENOSCOPY (EGD) WITH PROPOFOL;  Surgeon: Harvel Quale, MD;  [facility];  Service: Gastroenterology;  Laterality: N/A;   GIVENS CAPSULE STUDY N/A 08/11/2021   Procedure: GIVENS CAPSULE STUDY;  Surgeon: Harvel Quale, MD;  [facility];  Service: Gastroenterology;  Laterality: N/A;  7:30   PARATHYROIDECTOMY Right 02/01/2018   Procedure: RIGHT PARATHYROIDECTOMY;  Surgeon: Armandina Gemma, MD;  [facility]: [facility];  Service: General;  Laterality: Right;   TONSILLECTOMY     UPPER GI ENDOSCOPY N/A 12/06/2021   Procedure: UPPER ENDOSCOPY;  Surgeon: [name], MD;  Location: [facility];  Service: General;  Laterality: N/A;   XI ROBOTIC  ASSISTED HIATAL HERNIA REPAIR N/A 12/06/2021   Procedure: XI ROBOTIC ASSISTED LARGE HIATAL HERNIA REPAIR WITH PARTIAL FUNDOPLICATION;  Surgeon: [name], MD;  [facility]: [facility];  Service: General;  Laterality: N/A;    Family History: Family History  Problem Relation Age of Onset   Hypertension Mother    Hyperlipidemia Mother    Hyperlipidemia Father    Stroke Father    Cancer Father    Hypertension Sister    Hyperlipidemia Sister    Hypertension Brother    Hyperlipidemia Brother     Social History: Social History   Tobacco Use  Smoking Status Never   Passive exposure: Past  Smokeless Tobacco Never   Social History   Substance and Sexual Activity  Alcohol Use No   Social History   Substance and Sexual Activity  Drug Use No    Allergies: Allergies  Allergen Reactions   Lamisil [Terbinafine] Rash    Medications: Current Outpatient Medications  Medication Sig Dispense Refill   Cholecalciferol (VITAMIN D-3) 5000 units TABS Take 5,000 Units by mouth daily.     Cinnamon 500 MG TABS Take 1,000 mg by mouth daily.     dicyclomine (BENTYL) 10 MG capsule Take 10 mg by mouth daily at 6 (six) AM.     ezetimibe (ZETIA) 10 MG tablet Take 10 mg by mouth daily.     irbesartan (AVAPRO) 150 MG tablet Take 1 tablet (150 mg total) by mouth daily. 30 tablet 11   loperamide (IMODIUM) 2 MG capsule Take 2 mg by mouth as needed for diarrhea or loose stools. 1/2 tablet daily.     metoprolol succinate (TOPROL-XL) 25 MG 24 hr tablet Take 25 mg by mouth daily.     Multiple Vitamin (MULTIVITAMIN) tablet Take 1 tablet by mouth. Takes one a couple times per week     omeprazole (PRILOSEC) 40 MG capsule TAKE ONE CAPSULE BY MOUTH EVERY DAY 90 capsule 3   rosuvastatin (CRESTOR) 10 MG tablet Take 10 mg by mouth at bedtime.     simethicone (GAS-X) 80 MG chewable tablet Chew 1 tablet (80 mg total) by mouth 4 (four) times daily as needed for up to 14 days for flatulence. 100 tablet    zinc gluconate  50 MG tablet Take 50 mg by mouth daily.     No current facility-administered medications for this visit.    Review of Systems: GENERAL: negative for malaise, night sweats HEENT: No changes in hearing or vision, no nose bleeds or other nasal problems. NECK: Negative for lumps, goiter, pain and significant neck swelling RESPIRATORY: Negative for cough, wheezing CARDIOVASCULAR: Negative for chest pain, leg swelling, palpitations, orthopnea GI: SEE HPI MUSCULOSKELETAL: Negative for joint pain or swelling, back pain, and muscle pain. SKIN: Negative for lesions, rash PSYCH: Negative for sleep disturbance, mood disorder and recent psychosocial stressors. HEMATOLOGY Negative for prolonged bleeding, bruising easily, and swollen nodes. ENDOCRINE: Negative for cold or heat intolerance, polyuria, polydipsia and goiter. NEURO: negative for tremor, gait imbalance, syncope and seizures. The remainder of the review of systems is noncontributory.   Physical Exam: BP (!) 152/81 (BP Location: Right Arm, Patient Position: Sitting, Cuff Size: Large)   Pulse 71   Temp 98  F (36.7 C) (Oral)   Ht 5\' 3"  (1.6 m)   Wt 158 lb 14.4 oz (72.1 kg)   BMI 28.15 kg/m  GENERAL: The patient is AO x3, in no acute distress. HEENT: Head is normocephalic and atraumatic. EOMI are intact. Mouth is well hydrated and without lesions. NECK: Supple. No masses LUNGS: Clear to auscultation. No presence of rhonchi/wheezing/rales. Adequate chest expansion HEART: RRR, normal s1 and s2. ABDOMEN: Soft, nontender, no guarding, no peritoneal signs, and nondistended. BS +. No masses. EXTREMITIES: Without any cyanosis, clubbing, rash, lesions or edema. NEUROLOGIC: AOx3, no focal motor deficit. SKIN: no jaundice, no rashes  Imaging/Labs: as above  I personally reviewed and interpreted the available labs, imaging and endoscopic files.  Impression and Plan: Beth Garcia is a 72 y.o. female with PMH GERD, DM, HTN, HLD,  hyperparathyroidism s/p gland resection and large hiatal hernia complicated by [name] lesions, coming for follow up of IDA, bloating and diarrhea.  Patient was found to have SIBO/SIMO and her most recent testing, which responded adequately to a combination of neomycin and Xifaxan.  Her symptoms have completely resolved and then and she denies having any complaints.  We discussed about the pathogenesis, etiology and possible need of cycling antibiotics if her symptoms recur.  Will recommend taking daily probiotics to possibly prevent these episodes.  In terms of her chronic nausea, she has not presented any red flag signs.  Given her negative endoscopic and imaging investigations, this is unlikely related to an organic etiology.  She is not interested in taking any medications for days as her symptoms have not been severe.  Can try taking ginger on a regular basis to decrease the frequency of these symptoms.  The patient was found to have elevated blood pressure when vital signs were checked in the office. The blood pressure was rechecked by the nursing staff and it was found be persistently elevated >140/90 mmHg. I personally advised to the patient to follow up closely with PCP for hypertension control.  - Start daily probiotic intake - Can use Align or VSL#3 brands or take one that has Lactobacillus and Bifidobacterium - Can take ginger in the morning daily for nausea -If recurrent symptoms of SIBO/SIMO, will require a repeat breath test to determine if the patient needs neomycin or not.  All questions were answered.      Maylon Peppers, MD Gastroenterology and Hepatology [facility]

## 2022-11-11 DIAGNOSIS — E210 Primary hyperparathyroidism: Secondary | ICD-10-CM | POA: Diagnosis not present

## 2022-11-11 DIAGNOSIS — E039 Hypothyroidism, unspecified: Secondary | ICD-10-CM | POA: Diagnosis not present

## 2022-11-11 DIAGNOSIS — E782 Mixed hyperlipidemia: Secondary | ICD-10-CM | POA: Diagnosis not present

## 2022-11-11 DIAGNOSIS — K219 Gastro-esophageal reflux disease without esophagitis: Secondary | ICD-10-CM | POA: Diagnosis not present

## 2022-11-11 DIAGNOSIS — E1169 Type 2 diabetes mellitus with other specified complication: Secondary | ICD-10-CM | POA: Diagnosis not present

## 2022-11-11 DIAGNOSIS — E7849 Other hyperlipidemia: Secondary | ICD-10-CM | POA: Diagnosis not present

## 2022-11-16 DIAGNOSIS — Z6827 Body mass index (BMI) 27.0-27.9, adult: Secondary | ICD-10-CM | POA: Diagnosis not present

## 2022-11-16 DIAGNOSIS — D509 Iron deficiency anemia, unspecified: Secondary | ICD-10-CM | POA: Diagnosis not present

## 2022-11-16 DIAGNOSIS — E1169 Type 2 diabetes mellitus with other specified complication: Secondary | ICD-10-CM | POA: Diagnosis not present

## 2022-11-16 DIAGNOSIS — I10 Essential (primary) hypertension: Secondary | ICD-10-CM | POA: Diagnosis not present

## 2022-11-16 DIAGNOSIS — E7849 Other hyperlipidemia: Secondary | ICD-10-CM | POA: Diagnosis not present

## 2022-11-16 DIAGNOSIS — R002 Palpitations: Secondary | ICD-10-CM | POA: Diagnosis not present

## 2022-11-16 DIAGNOSIS — M810 Age-related osteoporosis without current pathological fracture: Secondary | ICD-10-CM | POA: Diagnosis not present

## 2022-11-29 DIAGNOSIS — Z6829 Body mass index (BMI) 29.0-29.9, adult: Secondary | ICD-10-CM | POA: Diagnosis not present

## 2022-11-29 DIAGNOSIS — I10 Essential (primary) hypertension: Secondary | ICD-10-CM | POA: Diagnosis not present

## 2022-11-29 DIAGNOSIS — R300 Dysuria: Secondary | ICD-10-CM | POA: Diagnosis not present

## 2022-11-29 DIAGNOSIS — N3001 Acute cystitis with hematuria: Secondary | ICD-10-CM | POA: Diagnosis not present

## 2022-11-29 DIAGNOSIS — E663 Overweight: Secondary | ICD-10-CM | POA: Diagnosis not present

## 2022-12-20 DIAGNOSIS — D3132 Benign neoplasm of left choroid: Secondary | ICD-10-CM | POA: Diagnosis not present

## 2023-01-30 DIAGNOSIS — I25118 Atherosclerotic heart disease of native coronary artery with other forms of angina pectoris: Secondary | ICD-10-CM | POA: Diagnosis not present

## 2023-01-30 DIAGNOSIS — E785 Hyperlipidemia, unspecified: Secondary | ICD-10-CM | POA: Diagnosis not present

## 2023-01-30 DIAGNOSIS — I10 Essential (primary) hypertension: Secondary | ICD-10-CM | POA: Diagnosis not present

## 2023-03-17 DIAGNOSIS — E782 Mixed hyperlipidemia: Secondary | ICD-10-CM | POA: Diagnosis not present

## 2023-03-17 DIAGNOSIS — E7849 Other hyperlipidemia: Secondary | ICD-10-CM | POA: Diagnosis not present

## 2023-03-17 DIAGNOSIS — E1165 Type 2 diabetes mellitus with hyperglycemia: Secondary | ICD-10-CM | POA: Diagnosis not present

## 2023-03-17 DIAGNOSIS — I10 Essential (primary) hypertension: Secondary | ICD-10-CM | POA: Diagnosis not present

## 2023-03-17 DIAGNOSIS — Z0001 Encounter for general adult medical examination with abnormal findings: Secondary | ICD-10-CM | POA: Diagnosis not present

## 2023-03-17 DIAGNOSIS — E210 Primary hyperparathyroidism: Secondary | ICD-10-CM | POA: Diagnosis not present

## 2023-03-17 DIAGNOSIS — E559 Vitamin D deficiency, unspecified: Secondary | ICD-10-CM | POA: Diagnosis not present

## 2023-03-22 DIAGNOSIS — M810 Age-related osteoporosis without current pathological fracture: Secondary | ICD-10-CM | POA: Diagnosis not present

## 2023-03-22 DIAGNOSIS — E7849 Other hyperlipidemia: Secondary | ICD-10-CM | POA: Diagnosis not present

## 2023-03-22 DIAGNOSIS — Z0001 Encounter for general adult medical examination with abnormal findings: Secondary | ICD-10-CM | POA: Diagnosis not present

## 2023-03-22 DIAGNOSIS — R002 Palpitations: Secondary | ICD-10-CM | POA: Diagnosis not present

## 2023-03-22 DIAGNOSIS — Z6827 Body mass index (BMI) 27.0-27.9, adult: Secondary | ICD-10-CM | POA: Diagnosis not present

## 2023-03-22 DIAGNOSIS — I10 Essential (primary) hypertension: Secondary | ICD-10-CM | POA: Diagnosis not present

## 2023-03-22 DIAGNOSIS — E1169 Type 2 diabetes mellitus with other specified complication: Secondary | ICD-10-CM | POA: Diagnosis not present

## 2023-03-22 DIAGNOSIS — Z23 Encounter for immunization: Secondary | ICD-10-CM | POA: Diagnosis not present

## 2023-03-22 DIAGNOSIS — D509 Iron deficiency anemia, unspecified: Secondary | ICD-10-CM | POA: Diagnosis not present

## 2023-04-03 DIAGNOSIS — M810 Age-related osteoporosis without current pathological fracture: Secondary | ICD-10-CM | POA: Diagnosis not present

## 2023-04-17 DIAGNOSIS — Z23 Encounter for immunization: Secondary | ICD-10-CM | POA: Diagnosis not present

## 2023-05-01 DIAGNOSIS — D3132 Benign neoplasm of left choroid: Secondary | ICD-10-CM | POA: Diagnosis not present

## 2023-06-05 DIAGNOSIS — H35033 Hypertensive retinopathy, bilateral: Secondary | ICD-10-CM | POA: Diagnosis not present

## 2023-06-05 DIAGNOSIS — H43823 Vitreomacular adhesion, bilateral: Secondary | ICD-10-CM | POA: Diagnosis not present

## 2023-06-05 DIAGNOSIS — E119 Type 2 diabetes mellitus without complications: Secondary | ICD-10-CM | POA: Diagnosis not present

## 2023-06-05 DIAGNOSIS — D3132 Benign neoplasm of left choroid: Secondary | ICD-10-CM | POA: Diagnosis not present

## 2023-06-05 DIAGNOSIS — H2513 Age-related nuclear cataract, bilateral: Secondary | ICD-10-CM | POA: Diagnosis not present

## 2023-06-20 DIAGNOSIS — H2513 Age-related nuclear cataract, bilateral: Secondary | ICD-10-CM | POA: Diagnosis not present

## 2023-07-17 ENCOUNTER — Other Ambulatory Visit (INDEPENDENT_AMBULATORY_CARE_PROVIDER_SITE_OTHER): Payer: Self-pay

## 2023-07-17 DIAGNOSIS — H2513 Age-related nuclear cataract, bilateral: Secondary | ICD-10-CM | POA: Diagnosis not present

## 2023-07-17 MED ORDER — OMEPRAZOLE 40 MG PO CPDR: 40.0000 mg | DELAYED_RELEASE_CAPSULE | Freq: Every day | ORAL | 1 refills | Status: DC

## 2023-07-17 NOTE — Telephone Encounter (Signed)
 [facility] is requesting a refill on Omeprazole 40 mg once daily.# 90 day supply. Last seen 09/19/2022.

## 2023-07-24 DIAGNOSIS — Z6827 Body mass index (BMI) 27.0-27.9, adult: Secondary | ICD-10-CM | POA: Diagnosis not present

## 2023-07-24 DIAGNOSIS — U071 COVID-19: Secondary | ICD-10-CM | POA: Diagnosis not present

## 2023-07-31 DIAGNOSIS — E7849 Other hyperlipidemia: Secondary | ICD-10-CM | POA: Diagnosis not present

## 2023-07-31 DIAGNOSIS — E782 Mixed hyperlipidemia: Secondary | ICD-10-CM | POA: Diagnosis not present

## 2023-07-31 DIAGNOSIS — E1165 Type 2 diabetes mellitus with hyperglycemia: Secondary | ICD-10-CM | POA: Diagnosis not present

## 2023-08-08 DIAGNOSIS — I2510 Atherosclerotic heart disease of native coronary artery without angina pectoris: Secondary | ICD-10-CM | POA: Diagnosis not present

## 2023-08-08 DIAGNOSIS — I10 Essential (primary) hypertension: Secondary | ICD-10-CM | POA: Diagnosis not present

## 2023-08-08 DIAGNOSIS — E7849 Other hyperlipidemia: Secondary | ICD-10-CM | POA: Diagnosis not present

## 2023-08-08 DIAGNOSIS — Z6827 Body mass index (BMI) 27.0-27.9, adult: Secondary | ICD-10-CM | POA: Diagnosis not present

## 2023-08-08 DIAGNOSIS — M5412 Radiculopathy, cervical region: Secondary | ICD-10-CM | POA: Diagnosis not present

## 2023-08-08 DIAGNOSIS — E782 Mixed hyperlipidemia: Secondary | ICD-10-CM | POA: Diagnosis not present

## 2023-08-08 DIAGNOSIS — E1165 Type 2 diabetes mellitus with hyperglycemia: Secondary | ICD-10-CM | POA: Diagnosis not present

## 2023-11-14 DIAGNOSIS — H16142 Punctate keratitis, left eye: Secondary | ICD-10-CM | POA: Diagnosis not present

## 2023-12-04 DIAGNOSIS — Z1329 Encounter for screening for other suspected endocrine disorder: Secondary | ICD-10-CM | POA: Diagnosis not present

## 2023-12-04 DIAGNOSIS — E1165 Type 2 diabetes mellitus with hyperglycemia: Secondary | ICD-10-CM | POA: Diagnosis not present

## 2023-12-04 DIAGNOSIS — I10 Essential (primary) hypertension: Secondary | ICD-10-CM | POA: Diagnosis not present

## 2023-12-04 DIAGNOSIS — E7849 Other hyperlipidemia: Secondary | ICD-10-CM | POA: Diagnosis not present

## 2023-12-06 DIAGNOSIS — I10 Essential (primary) hypertension: Secondary | ICD-10-CM | POA: Diagnosis not present

## 2023-12-06 DIAGNOSIS — E782 Mixed hyperlipidemia: Secondary | ICD-10-CM | POA: Diagnosis not present

## 2023-12-06 DIAGNOSIS — Z6826 Body mass index (BMI) 26.0-26.9, adult: Secondary | ICD-10-CM | POA: Diagnosis not present

## 2023-12-06 DIAGNOSIS — E7849 Other hyperlipidemia: Secondary | ICD-10-CM | POA: Diagnosis not present

## 2023-12-06 DIAGNOSIS — M5412 Radiculopathy, cervical region: Secondary | ICD-10-CM | POA: Diagnosis not present

## 2023-12-06 DIAGNOSIS — E1165 Type 2 diabetes mellitus with hyperglycemia: Secondary | ICD-10-CM | POA: Diagnosis not present

## 2023-12-14 DIAGNOSIS — L570 Actinic keratosis: Secondary | ICD-10-CM | POA: Diagnosis not present

## 2024-01-12 ENCOUNTER — Other Ambulatory Visit (INDEPENDENT_AMBULATORY_CARE_PROVIDER_SITE_OTHER): Payer: Self-pay | Admitting: Gastroenterology

## 2024-01-12 NOTE — Telephone Encounter (Signed)
Will refill medication for 3 months, needs follow up appointment with any provider in order to receive any refills.  Thanks,  Markhi Kleckner Castaneda, MD Gastroenterology and Hepatology [facility]  

## 2024-01-12 NOTE — Telephone Encounter (Signed)
 Will refill medication for 3 months, needs follow up appointment with any provider in order to receive any refills.   Thanks,   Toribio Fortune, MD Gastroenterology and Hepatology [facility]  Per Dr.Castaneda  Please call pt to schedule appt. Thank you!!!!

## 2024-02-01 ENCOUNTER — Encounter (INDEPENDENT_AMBULATORY_CARE_PROVIDER_SITE_OTHER): Payer: Self-pay | Admitting: Gastroenterology

## 2024-02-01 ENCOUNTER — Ambulatory Visit (INDEPENDENT_AMBULATORY_CARE_PROVIDER_SITE_OTHER): Admitting: Gastroenterology

## 2024-02-01 VITALS — BP 120/73 | HR 61 | Temp 98.9°F | Ht 63.0 in | Wt 156.9 lb

## 2024-02-01 DIAGNOSIS — K219 Gastro-esophageal reflux disease without esophagitis: Secondary | ICD-10-CM

## 2024-02-01 DIAGNOSIS — K63829 Intestinal methanogen overgrowth, unspecified: Secondary | ICD-10-CM | POA: Diagnosis not present

## 2024-02-01 MED ORDER — NEOMYCIN SULFATE 500 MG PO TABS: 500.0000 mg | ORAL_TABLET | Freq: Two times a day (BID) | ORAL | 0 refills | Status: AC

## 2024-02-01 MED ORDER — OMEPRAZOLE 40 MG PO CPDR: 40.0000 mg | DELAYED_RELEASE_CAPSULE | Freq: Every day | ORAL | 0 refills | Status: DC

## 2024-02-01 MED ORDER — RIFAXIMIN 550 MG PO TABS: 550.0000 mg | ORAL_TABLET | Freq: Three times a day (TID) | ORAL | 0 refills | Status: AC

## 2024-02-01 NOTE — Progress Notes (Unsigned)
 Toribio Fortune, M.D. Gastroenterology & Hepatology [facility] [address] [place], [place] [address]  Primary Care Physician: Toribio Jerel MATSU, MD [address] [place] [address]  I will communicate my assessment and recommendations to the referring MD via EMR.  Problems: Iron deficiency anemia Cameron lesions Large hiatal hernia (8 cm) s/p robotic repair SIMO/SIBO   History of Present Illness: MEGYN LENG is a 73 y.o. female with PMH GERD, DM, HTN, HLD, hyperparathyroidism s/p gland resection and large hiatal hernia complicated by Ole lesions, coming for follow up of GERD, bloating and diarrhea.   The patient was last seen on 09/19/2022. At that time, the patient was started on probiotics and ginger for nausea.  She reports that she has had recurrence of her issues with diarrhea and bloating. Every two weeks she has an episode of acute diarrhea, has 2-3 watery bowel movements with an orange yellow looking appearance, but no melena or fresh blood.  Sometimes she has bloating in her abdomen but no abdominal pain. She is currently taking 1/2 pill of Imodium in the morning. However, she may have to take Imodium and Peptobismol if diarrhea recurs.  She takes Librarian, academic every day.  Has not noticed any benefit from using this.  Also reports that she is taking omeprazole  daily.  Denies any heartburn or dysphagia.  The patient denies having any nausea, vomiting, fever, chills, hematochezia, melena, hematemesis, jaundice, pruritus or weight loss.  Last EGD:08/19/2020, showed an 8 cm hiatal hernia with Ole erosions, normal stomach otherwise and normal duodenum (biopsies negative for celiac disease).  Last Colonoscopy: 08/19/2020, a normal terminal ileum with presence of diverticula in the sigmoid colon.  Past Medical History: Past Medical History:  Diagnosis Date   Anemia    recent iron transfusions in 2023   Arthritis    Hands   Diabetes  mellitus, type II ([facility])    diet and exercise controlled   Dyspnea    Elevated temperature    Notmal temp is 99-100   GERD (gastroesophageal reflux disease)    Hypercalcemia    Hyperlipidemia    Hypertension    Vitamin D  deficiency     Past Surgical History: Past Surgical History:  Procedure Laterality Date   ABDOMINAL HYSTERECTOMY     bilateral breast biopsy     BIOPSY  08/19/2020   Procedure: BIOPSY;  Surgeon: Fortune Angelia Toribio, MD;  [facility];  Service: Gastroenterology;;  duodenum   COLONOSCOPY W/ POLYPECTOMY     COLONOSCOPY WITH PROPOFOL  N/A 08/19/2020   Procedure: COLONOSCOPY WITH PROPOFOL ;  Surgeon: Fortune Angelia Toribio, MD;  [facility];  Service: Gastroenterology;  Laterality: N/A;  7:30   ESOPHAGOGASTRODUODENOSCOPY (EGD) WITH PROPOFOL  N/A 08/19/2020   Procedure: ESOPHAGOGASTRODUODENOSCOPY (EGD) WITH PROPOFOL ;  Surgeon: Fortune Angelia Toribio, MD;  [facility];  Service: Gastroenterology;  Laterality: N/A;   GIVENS CAPSULE STUDY N/A 08/11/2021   Procedure: GIVENS CAPSULE STUDY;  Surgeon: Fortune Angelia Toribio, MD;  [facility];  Service: Gastroenterology;  Laterality: N/A;  7:30   PARATHYROIDECTOMY Right 02/01/2018   Procedure: RIGHT PARATHYROIDECTOMY;  Surgeon: Eletha Boas, MD;  [facility];  Service: General;  Laterality: Right;   TONSILLECTOMY     UPPER GI ENDOSCOPY N/A 12/06/2021   Procedure: UPPER ENDOSCOPY;  Surgeon: Tanda Locus, MD;  [facility];  Service: General;  Laterality: N/A;   XI ROBOTIC ASSISTED HIATAL HERNIA REPAIR N/A 12/06/2021   Procedure: XI ROBOTIC  ASSISTED LARGE HIATAL HERNIA REPAIR WITH PARTIAL FUNDOPLICATION;  Surgeon: Tanda Locus, MD;  [facility];  Service: General;  Laterality: N/A;    Family History: Family History  Problem Relation Age of Onset   Hypertension Mother    Hyperlipidemia Mother    Hyperlipidemia Father    Stroke Father    Cancer Father     Hypertension Sister    Hyperlipidemia Sister    Hypertension Brother    Hyperlipidemia Brother     Social History: Social History   Tobacco Use  Smoking Status Never   Passive exposure: Past  Smokeless Tobacco Never   Social History   Substance and Sexual Activity  Alcohol  Use No   Social History   Substance and Sexual Activity  Drug Use No    Allergies: Allergies  Allergen Reactions   Lamisil [Terbinafine] Rash    Medications: Current Outpatient Medications  Medication Sig Dispense Refill   Cholecalciferol (VITAMIN D -3) 5000 units TABS Take 5,000 Units by mouth daily.     Cinnamon 500 MG TABS Take 1,000 mg by mouth daily.     dicyclomine (BENTYL) 10 MG capsule Take 10 mg by mouth daily at 6 (six) AM.     ezetimibe (ZETIA) 10 MG tablet Take 10 mg by mouth daily.     irbesartan  (AVAPRO ) 150 MG tablet Take 1 tablet (150 mg total) by mouth daily. 30 tablet 11   loperamide (IMODIUM) 2 MG capsule Take 2 mg by mouth as needed for diarrhea or loose stools. 1/2 tablet daily.     metoprolol  succinate (TOPROL -XL) 25 MG 24 hr tablet Take 25 mg by mouth daily.     Multiple Vitamin (MULTIVITAMIN) tablet Take 1 tablet by mouth. Takes one a couple times per week     omeprazole  (PRILOSEC) 40 MG capsule TAKE ONE CAPSULE BY MOUTH DAILY 90 capsule 0   rosuvastatin (CRESTOR) 10 MG tablet Take 10 mg by mouth at bedtime.     simethicone  (GAS-X) 80 MG chewable tablet Chew 1 tablet (80 mg total) by mouth 4 (four) times daily as needed for up to 14 days for flatulence. 100 tablet    zinc gluconate 50 MG tablet Take 50 mg by mouth daily.     No current facility-administered medications for this visit.    Review of Systems: GENERAL: negative for malaise, night sweats HEENT: No changes in hearing or vision, no nose bleeds or other nasal problems. NECK: Negative for lumps, goiter, pain and significant neck swelling RESPIRATORY: Negative for cough, wheezing CARDIOVASCULAR: Negative for chest  pain, leg swelling, palpitations, orthopnea GI: SEE HPI MUSCULOSKELETAL: Negative for joint pain or swelling, back pain, and muscle pain. SKIN: Negative for lesions, rash PSYCH: Negative for sleep disturbance, mood disorder and recent psychosocial stressors. HEMATOLOGY Negative for prolonged bleeding, bruising easily, and swollen nodes. ENDOCRINE: Negative for cold or heat intolerance, polyuria, polydipsia and goiter. NEURO: negative for tremor, gait imbalance, syncope and seizures. The remainder of the review of systems is noncontributory.   Physical Exam: BP 120/73   Pulse 61   Temp 98.9 F (37.2 C)   Ht 5' 3 (1.6 m)   Wt 156 lb 14.4 oz (71.2 kg)   BMI 27.79 kg/m  GENERAL: The patient is AO x3, in no acute distress. HEENT: Head is normocephalic and atraumatic. EOMI are intact. Mouth is well hydrated and without lesions. NECK: Supple. No masses LUNGS: Clear to auscultation. No presence of rhonchi/wheezing/rales. Adequate chest expansion HEART: RRR, normal s1 and s2. ABDOMEN:  Soft, nontender, no guarding, no peritoneal signs, and nondistended. BS +. No masses. RECTAL EXAM: no external lesions, normal tone, no masses, brown stool without blood.*** Chaperone: EXTREMITIES: Without any cyanosis, clubbing, rash, lesions or edema. NEUROLOGIC: AOx3, no focal motor deficit. SKIN: no jaundice, no rashes  Imaging/Labs: as above  I personally reviewed and interpreted the available labs, imaging and endoscopic files.  Impression and Plan: VONDRA ALDREDGE is a 73 y.o. female coming for follow up of ***   All questions were answered.      Toribio Fortune, MD Gastroenterology and Hepatology [facility]

## 2024-02-01 NOTE — Patient Instructions (Addendum)
 Continue omeprazole  40 mg daily Take Xifaxan  550 mg 3 times a day and neomycin  500 mg twice daily Paper prescription provided to obtain Xifaxan  from [place]

## 2024-03-11 ENCOUNTER — Encounter (INDEPENDENT_AMBULATORY_CARE_PROVIDER_SITE_OTHER): Payer: Self-pay | Admitting: Gastroenterology

## 2024-04-01 DIAGNOSIS — Z1329 Encounter for screening for other suspected endocrine disorder: Secondary | ICD-10-CM | POA: Diagnosis not present

## 2024-04-01 DIAGNOSIS — Z130 Encounter for screening for diseases of the blood and blood-forming organs and certain disorders involving the immune mechanism: Secondary | ICD-10-CM | POA: Diagnosis not present

## 2024-04-01 DIAGNOSIS — E1165 Type 2 diabetes mellitus with hyperglycemia: Secondary | ICD-10-CM | POA: Diagnosis not present

## 2024-04-01 DIAGNOSIS — E7849 Other hyperlipidemia: Secondary | ICD-10-CM | POA: Diagnosis not present

## 2024-04-17 DIAGNOSIS — Z23 Encounter for immunization: Secondary | ICD-10-CM | POA: Diagnosis not present

## 2024-05-08 ENCOUNTER — Encounter (INDEPENDENT_AMBULATORY_CARE_PROVIDER_SITE_OTHER): Payer: Self-pay | Admitting: Gastroenterology

## 2024-06-05 DIAGNOSIS — Z1389 Encounter for screening for other disorder: Secondary | ICD-10-CM | POA: Diagnosis not present

## 2024-06-05 DIAGNOSIS — Z0001 Encounter for general adult medical examination with abnormal findings: Secondary | ICD-10-CM | POA: Diagnosis not present

## 2024-06-05 DIAGNOSIS — Z1331 Encounter for screening for depression: Secondary | ICD-10-CM | POA: Diagnosis not present

## 2024-06-05 DIAGNOSIS — K580 Irritable bowel syndrome with diarrhea: Secondary | ICD-10-CM | POA: Diagnosis not present

## 2024-06-05 DIAGNOSIS — E1165 Type 2 diabetes mellitus with hyperglycemia: Secondary | ICD-10-CM | POA: Diagnosis not present

## 2024-06-05 DIAGNOSIS — Z6826 Body mass index (BMI) 26.0-26.9, adult: Secondary | ICD-10-CM | POA: Diagnosis not present

## 2024-06-05 DIAGNOSIS — I10 Essential (primary) hypertension: Secondary | ICD-10-CM | POA: Diagnosis not present

## 2024-06-05 DIAGNOSIS — D509 Iron deficiency anemia, unspecified: Secondary | ICD-10-CM | POA: Diagnosis not present

## 2024-06-05 DIAGNOSIS — M5412 Radiculopathy, cervical region: Secondary | ICD-10-CM | POA: Diagnosis not present

## 2024-06-18 DIAGNOSIS — D3132 Benign neoplasm of left choroid: Secondary | ICD-10-CM | POA: Diagnosis not present

## 2024-07-17 ENCOUNTER — Other Ambulatory Visit (INDEPENDENT_AMBULATORY_CARE_PROVIDER_SITE_OTHER): Payer: Self-pay

## 2024-07-17 DIAGNOSIS — K219 Gastro-esophageal reflux disease without esophagitis: Secondary | ICD-10-CM

## 2024-07-17 MED ORDER — OMEPRAZOLE 40 MG PO CPDR: 40.0000 mg | DELAYED_RELEASE_CAPSULE | Freq: Every day | ORAL | 0 refills | Status: AC
